# Patient Record
Sex: Female | Born: 1935 | Race: White | Hispanic: No | Marital: Single | Smoking: Never smoker
Health system: Southern US, Community
[De-identification: ages and names within clinical notes are randomized; demographics above are authoritative.]

## PROBLEM LIST (undated history)

## (undated) DIAGNOSIS — R4189 Other symptoms and signs involving cognitive functions and awareness: Secondary | ICD-10-CM

## (undated) DIAGNOSIS — F0390 Unspecified dementia without behavioral disturbance: Secondary | ICD-10-CM

## (undated) DIAGNOSIS — E785 Hyperlipidemia, unspecified: Secondary | ICD-10-CM

## (undated) DIAGNOSIS — K141 Geographic tongue: Secondary | ICD-10-CM

## (undated) DIAGNOSIS — E7800 Pure hypercholesterolemia, unspecified: Secondary | ICD-10-CM

## (undated) DIAGNOSIS — I7300 Raynaud's syndrome without gangrene: Secondary | ICD-10-CM

## (undated) DIAGNOSIS — I341 Nonrheumatic mitral (valve) prolapse: Secondary | ICD-10-CM

---

## 2017-01-03 ENCOUNTER — Other Ambulatory Visit: Payer: Self-pay | Admitting: Nurse Practitioner

## 2017-01-03 DIAGNOSIS — R413 Other amnesia: Secondary | ICD-10-CM

## 2017-01-12 ENCOUNTER — Encounter: Payer: Self-pay | Admitting: Radiology

## 2017-01-12 ENCOUNTER — Ambulatory Visit
Admission: RE | Admit: 2017-01-12 | Discharge: 2017-01-12 | Disposition: A | Discharge status: Home / Self Care | Payer: Medicare Other | Source: Ambulatory Visit | Attending: Nurse Practitioner | Admitting: Nurse Practitioner

## 2017-01-12 DIAGNOSIS — R413 Other amnesia: Secondary | ICD-10-CM

## 2017-01-12 DIAGNOSIS — I6782 Cerebral ischemia: Secondary | ICD-10-CM | POA: Diagnosis not present

## 2017-01-12 DIAGNOSIS — G3184 Mild cognitive impairment, so stated: Secondary | ICD-10-CM | POA: Insufficient documentation

## 2017-01-12 LAB — POCT I-STAT CREATININE: Creatinine, Ser: 0.8 mg/dL (ref 0.44–1.00)

## 2017-01-12 MED ORDER — GADOBENATE DIMEGLUMINE 529 MG/ML IV SOLN
10.0000 mL | Freq: Once | INTRAVENOUS | Status: AC | PRN
  Administered 2017-01-12: 8 mL via INTRAVENOUS

## 2017-05-14 ENCOUNTER — Ambulatory Visit
Admission: EM | Admit: 2017-05-14 | Discharge: 2017-05-14 | Disposition: A | Discharge status: Home / Self Care | Payer: Medicare Other | Attending: Family Medicine | Admitting: Family Medicine

## 2017-05-14 DIAGNOSIS — A084 Viral intestinal infection, unspecified: Secondary | ICD-10-CM | POA: Diagnosis not present

## 2017-05-14 HISTORY — DX: Nonrheumatic mitral (valve) prolapse: I34.1

## 2017-05-14 HISTORY — DX: Raynaud's syndrome without gangrene: I73.00

## 2017-05-14 HISTORY — DX: Geographic tongue: K14.1

## 2017-05-14 HISTORY — DX: Hyperlipidemia, unspecified: E78.5

## 2017-05-14 HISTORY — DX: Pure hypercholesterolemia, unspecified: E78.00

## 2017-05-14 HISTORY — DX: Other symptoms and signs involving cognitive functions and awareness: R41.89

## 2017-05-14 LAB — BASIC METABOLIC PANEL
Anion gap: 11 (ref 5–15)
BUN: 24 mg/dL — AB (ref 6–20)
CHLORIDE: 101 mmol/L (ref 101–111)
CO2: 27 mmol/L (ref 22–32)
CREATININE: 0.84 mg/dL (ref 0.44–1.00)
Calcium: 8.8 mg/dL — ABNORMAL LOW (ref 8.9–10.3)
GFR calc non Af Amer: >60 mL/min (ref >60)
Glucose, Bld: 117 mg/dL — ABNORMAL HIGH (ref 65–99)
Potassium: 3.9 mmol/L (ref 3.5–5.1)
SODIUM: 139 mmol/L (ref 135–145)

## 2017-05-14 MED ORDER — ONDANSETRON 8 MG PO TBDP: 8.0000 mg | ORAL_TABLET | Freq: Three times a day (TID) | ORAL | 0 refills | Status: DC | PRN

## 2017-05-14 NOTE — ED Triage Notes (Signed)
Pt threw at the lunch table today and since she has thrown up 4 times since.

## 2017-05-14 NOTE — ED Provider Notes (Signed)
[facility]    CSN: [id number] Arrival date & time: 05/14/17  1712     History   Chief Complaint Chief Complaint  Patient presents with  . Nausea    HPI Blenda BridegroomLinda Urschel is a 82 y.o. female.   The history is provided by the patient.  Emesis  Severity:  Moderate Duration:  8 hours Timing:  Intermittent Number of daily episodes:  5 Quality:  Stomach contents Able to tolerate:  Liquids Progression:  Unchanged Chronicity:  New Recent urination:  Normal Relieved by:  None tried Ineffective treatments:  None tried Associated symptoms: no abdominal pain, no arthralgias, no chills, no cough, no diarrhea, no fever, no headaches, no myalgias and no URI   Associated symptoms comment:  Mild abdominal cramping and slightly loose stool today Risk factors: sick contacts   Risk factors: no alcohol use, no diabetes, not pregnant, no suspect food intake and no travel to endemic areas     Past Medical History:  Diagnosis Date  . Cognitive impairment   . Geographic tongue   . Hypercholesteremia   . Hyperlipidemia   . Nonrheumatic mitral (valve) prolapse   . Raynaud's syndrome without gangrene     There are no active problems to display for this patient.   History reviewed. No pertinent surgical history.  OB History    No data available       Home Medications    Prior to Admission medications   Medication Sig Start Date End Date Taking? Authorizing Provider  aspirin 81 MG chewable tablet Chew 81 mg by mouth daily.   Yes [provider]  atorvastatin (LIPITOR) 10 MG tablet Take 10 mg by mouth daily.   Yes [provider]  Melatonin 3 MG TABS Take 3 mg by mouth.   Yes [provider]  Multiple Vitamin (MULTIVITAMINS PO) Take by mouth.   Yes [provider]  Polyethylene Glycol 3350 POWD by Does not apply route.   Yes [provider]  ondansetron (ZOFRAN ODT) 8 MG disintegrating tablet Take 1 tablet (8 mg total) by mouth  every 8 (eight) hours as needed. 05/14/17   Payton Mccallumonty, Kristiann Noyce, MD    Family History History reviewed. No pertinent family history.  Social History Social History  Substance Use Topics  . Smoking status: Never Smoker  . Smokeless tobacco: Never Used  . Alcohol use No     Allergies   Patient has no known allergies.   Review of Systems Review of Systems  Constitutional: Negative for chills and fever.  Respiratory: Negative for cough.   Gastrointestinal: Positive for vomiting. Negative for abdominal pain and diarrhea.  Musculoskeletal: Negative for arthralgias and myalgias.  Neurological: Negative for headaches.     Physical Exam Triage Vital Signs ED Triage Vitals  Enc Vitals Group     BP 05/14/17 1734 (!) 164/61     Pulse Rate 05/14/17 1734 97     Resp 05/14/17 1734 18     Temp 05/14/17 1734 98.2 F (36.8 C)     Temp Source 05/14/17 1734 Oral     SpO2 05/14/17 1734 97 %     Weight 05/14/17 1731 108 lb (49 kg)     Height 05/14/17 1731 5\' 3"  (1.6 m)     Head Circumference --      Peak Flow --      Pain Score 05/14/17 1732 5     Pain Loc --      Pain Edu? --  Excl. in GC? --    No data found.   Updated Vital Signs BP (!) 164/61 (BP Location: Right Arm)   Pulse 97   Temp 98.2 F (36.8 C) (Oral)   Resp 18   Ht 5\' 3"  (1.6 m)   Wt 108 lb (49 kg)   SpO2 97%   BMI 19.13 kg/m   Visual Acuity Right Eye Distance:   Left Eye Distance:   Bilateral Distance:    Right Eye Near:   Left Eye Near:    Bilateral Near:     Physical Exam  Constitutional: She appears well-developed and well-nourished. No distress.  Abdominal: Soft. Bowel sounds are normal. She exhibits no distension and no mass. There is no tenderness. There is no rebound and no guarding.  Skin: She is not diaphoretic.  Nursing note and vitals reviewed.    UC Treatments / Results  Labs (all labs ordered are listed, but only abnormal results are displayed) Labs Reviewed  BASIC METABOLIC PANEL  - Abnormal; Notable for the following:       Result Value   Glucose, Bld 117 (*)    BUN 24 (*)    Calcium 8.8 (*)    All other components within normal limits    EKG  EKG Interpretation None       Radiology No results found.  Procedures Procedures (including critical care time)  Medications Ordered in UC Medications - No data to display   Initial Impression / Assessment and Plan / UC Course  I have reviewed the triage vital signs and the nursing notes.  Pertinent labs & imaging results that were available during my care of the patient were reviewed by me and considered in my medical decision making (see chart for details).      Final Clinical Impressions(s) / UC Diagnoses   Final diagnoses:  Viral gastroenteritis    New Prescriptions New Prescriptions   ONDANSETRON (ZOFRAN ODT) 8 MG DISINTEGRATING TABLET    Take 1 tablet (8 mg total) by mouth every 8 (eight) hours as needed.   1. Lab results and diagnosis reviewed with patient 2. rx as per orders above; reviewed possible side effects, interactions, risks and benefits  3. Recommend supportive treatment with clear liquids/increased fluids 4. Follow-up prn if symptoms worsen or don't improve   Payton Mccallum, MD 05/14/17 1857

## 2017-05-19 ENCOUNTER — Encounter: Payer: Self-pay | Admitting: Emergency Medicine

## 2017-05-19 ENCOUNTER — Emergency Department: Payer: Medicare Other

## 2017-05-19 ENCOUNTER — Emergency Department
Admission: EM | Admit: 2017-05-19 | Discharge: 2017-05-19 | Disposition: A | Discharge status: Home / Self Care | Payer: Medicare Other | Attending: Student in an Organized Health Care Education/Training Program | Admitting: Student in an Organized Health Care Education/Training Program

## 2017-05-19 DIAGNOSIS — S0990XA Unspecified injury of head, initial encounter: Secondary | ICD-10-CM | POA: Insufficient documentation

## 2017-05-19 DIAGNOSIS — Y9301 Activity, walking, marching and hiking: Secondary | ICD-10-CM | POA: Diagnosis not present

## 2017-05-19 DIAGNOSIS — S42034A Nondisplaced fracture of lateral end of right clavicle, initial encounter for closed fracture: Secondary | ICD-10-CM | POA: Insufficient documentation

## 2017-05-19 DIAGNOSIS — S32010A Wedge compression fracture of first lumbar vertebra, initial encounter for closed fracture: Secondary | ICD-10-CM | POA: Diagnosis not present

## 2017-05-19 DIAGNOSIS — Y999 Unspecified external cause status: Secondary | ICD-10-CM | POA: Insufficient documentation

## 2017-05-19 DIAGNOSIS — Z7982 Long term (current) use of aspirin: Secondary | ICD-10-CM | POA: Diagnosis not present

## 2017-05-19 DIAGNOSIS — W01198A Fall on same level from slipping, tripping and stumbling with subsequent striking against other object, initial encounter: Secondary | ICD-10-CM | POA: Insufficient documentation

## 2017-05-19 DIAGNOSIS — Y92129 Unspecified place in nursing home as the place of occurrence of the external cause: Secondary | ICD-10-CM | POA: Diagnosis not present

## 2017-05-19 DIAGNOSIS — S4991XA Unspecified injury of right shoulder and upper arm, initial encounter: Secondary | ICD-10-CM | POA: Diagnosis present

## 2017-05-19 DIAGNOSIS — Z79899 Other long term (current) drug therapy: Secondary | ICD-10-CM | POA: Diagnosis not present

## 2017-05-19 MED ORDER — ACETAMINOPHEN 325 MG PO TABS: 650.0000 mg | ORAL_TABLET | ORAL | 2 refills | Status: AC | PRN

## 2017-05-19 MED ORDER — LIDOCAINE 5 % EX PTCH: 1.0000 | MEDICATED_PATCH | Freq: Two times a day (BID) | CUTANEOUS | 0 refills | Status: AC

## 2017-05-19 MED ORDER — ACETAMINOPHEN 325 MG PO TABS
650.0000 mg | ORAL_TABLET | Freq: Once | ORAL | Status: AC
Start: 2017-05-19 — End: 2017-05-19
  Administered 2017-05-19: 650 mg via ORAL
  Filled 2017-05-19: qty 2

## 2017-05-19 NOTE — ED Notes (Signed)
Pt's niece and POA at bedside at this time.

## 2017-05-19 NOTE — ED Notes (Signed)
This RN apologized for delay. Pt and family state understanding at this time. Will continue to monitor for further patient needs at this time. Pt is in NAD.

## 2017-05-19 NOTE — ED Provider Notes (Signed)
[facility] Emergency Department Provider Note    None    (approximate)  I have reviewed the triage vital signs and the nursing notes.   HISTORY  Chief Complaint Fall and Shoulder Pain    HPI Angelica Collins is a 82 y.o. female presents from [facility] after mechanical fall with complaint of right shoulder pain and low back pain. No numbness or tingling. Denies loss of consciousness.  Also has bruising to left forehead but denies any headache or neck pain.No numbness or tingling. No chest pain or shortness of breath. No palpitations. No abdominal pain.   Past Medical History:  Diagnosis Date  . Cognitive impairment   . Geographic tongue   . Hypercholesteremia   . Hyperlipidemia   . Nonrheumatic mitral (valve) prolapse   . Raynaud's syndrome without gangrene    History reviewed. No pertinent family history. History reviewed. No pertinent surgical history. There are no active problems to display for this patient.     Prior to Admission medications   Medication Sig Start Date End Date Taking? Authorizing Provider  aspirin 81 MG chewable tablet Chew 81 mg by mouth daily.    [provider]  atorvastatin (LIPITOR) 10 MG tablet Take 10 mg by mouth daily.    [provider]  Melatonin 3 MG TABS Take 3 mg by mouth.    [provider]  Multiple Vitamin (MULTIVITAMINS PO) Take by mouth.    [provider]  ondansetron (ZOFRAN ODT) 8 MG disintegrating tablet Take 1 tablet (8 mg total) by mouth every 8 (eight) hours as needed. 05/14/17   Payton Mccallumonty, [place], MD  Polyethylene Glycol 3350 POWD by Does not apply route.    [provider]    Allergies Patient has no known allergies.    Social History Social History  Substance Use Topics  . Smoking status: Never Smoker  . Smokeless tobacco: Never Used  . Alcohol use No    Review of Systems Patient denies headaches, rhinorrhea, blurry vision, numbness, shortness  of breath, chest pain, edema, cough, abdominal pain, nausea, vomiting, diarrhea, dysuria, fevers, rashes or hallucinations unless otherwise stated above in HPI. ____________________________________________   PHYSICAL EXAM:  VITAL SIGNS: Vitals:   05/19/17 1308  BP: (!) 161/94  Pulse: (!) 102  Resp: 18  Temp: 98.3 F (36.8 C)    Constitutional: Alert Well appearing and in no acute distress. Eyes: Conjunctivae are normal. EOMI Head: Small contusion and ecchymosis above left eyebrow with dependent ecchymosis medial eye without any proptosis. Nose: No congestion/rhinnorhea. Mouth/Throat: Mucous membranes are moist.   Neck: No stridor. Painless ROM.  Cardiovascular: Normal rate, regular rhythm. Grossly normal heart sounds.  Good peripheral circulation. Respiratory: Normal respiratory effort.  No retractions. Lungs CTAB. Gastrointestinal: Soft and nontender. No distention. No abdominal bruits. No CVA tenderness. Musculoskeletal: No lower extremity tenderness nor edema.  There is tender to palpation of the distal clavicle without effusion. Able to range shoulder but with some discomfort. Also with low back pain around L1-L2 region without overlying ecchymosis, step-offs or deformities. Neurologic:  Normal speech and language. No gross focal neurologic deficits are appreciated. No facial droop.  Able to ambulate without weakness, or significant difficulty. Skin:  Skin is warm, dry and intact. No rash noted. Psychiatric: Mood and affect are normal. Speech and behavior are normal.  ____________________________________________   LABS (all labs ordered are listed, but only abnormal results are displayed)  No results found for this or any previous visit (from the  past 24 hour(s)). ____________________________________________ ____________________________________________  RADIOLOGY  I personally reviewed all radiographic images ordered to evaluate for the above acute complaints and reviewed  radiology reports and findings.  These findings were personally discussed with the patient.  Please see medical record for radiology report.  ____________________________________________   PROCEDURES  Procedure(s) performed:  Procedures    Critical Care performed: no ____________________________________________   INITIAL IMPRESSION / ASSESSMENT AND PLAN / ED COURSE  Pertinent labs & imaging results that were available during my care of the patient were reviewed by me and considered in my medical decision making (see chart for details).  DDX: fracture, contusion, sprain, ich  Angelica Collins is a 82 y.o. who presents to the ED with chief complaint of right shoulder pain and low back pain and head injury as above. CT imaging of the head ordered to evaluate for acute intracranial abnormality shows no injury. No clinical findings suggests a retrobulbar hematoma. Extraocular motions are intact. X-ray ordered to evaluate for shoulder pain and low back pain shows evidence of nondisplaced distal clavicle fracture. Patient placed in sling.  Patient also with evidence of mild compression fracture. She has no evidence of retropulsion and no neuro deficits at this time. She is able to ambulate with no significant discomfort therefore the chronicity of this abnormality is uncertain. We'll give referral for orthopedics. Does have physical therapy available at [facility] which she will continue.  Have discussed with the patient and available family all diagnostics and treatments performed thus far and all questions were answered to the best of my ability. The patient demonstrates understanding and agreement with plan.       ____________________________________________   FINAL CLINICAL IMPRESSION(S) / ED DIAGNOSES  Final diagnoses:  Closed nondisplaced fracture of acromial end of right clavicle, initial encounter  Closed compression fracture of first lumbar vertebra, initial encounter (HCC)       NEW MEDICATIONS STARTED DURING THIS VISIT:  New Prescriptions   No medications on file     Note:  This document was prepared using Dragon voice recognition software and may include unintentional dictation errors.    Willy Eddy, MD 05/19/17 1540

## 2017-05-19 NOTE — ED Notes (Signed)
Pt assisted to the bathroom at this time.

## 2017-05-19 NOTE — ED Triage Notes (Signed)
Pt presents to ED via [facility] s/p mechanical fall. Per EMS pt was walking with her walker when she had an unwitnessed fall. Per EMS no LOC, pt c/o R shoulder pain with a hx of a fracture to that shoulder. Pt also noted to have a hematoma to L side of her forehead, pt denies pain to her head or her neck, states that her back is beginning to hurt at this time. Pt is alert and oriented at this time.

## 2017-05-19 NOTE — ED Notes (Signed)
NAD noted at time of D/C. Pt taken to lobby via wheelchair. Pt D/C into the care of her POA at time of D/C. Denies any comments/concerns at this time.

## 2017-05-23 ENCOUNTER — Other Ambulatory Visit: Payer: Self-pay | Admitting: Orthopedic Surgery

## 2017-05-23 DIAGNOSIS — M545 Low back pain: Secondary | ICD-10-CM

## 2017-05-25 ENCOUNTER — Encounter: Payer: Self-pay | Admitting: Intensive Care

## 2017-05-25 ENCOUNTER — Emergency Department
Admission: EM | Admit: 2017-05-25 | Discharge: 2017-05-25 | Disposition: A | Discharge status: Home / Self Care | Payer: Medicare Other | Attending: Emergency Medicine | Admitting: Emergency Medicine

## 2017-05-25 ENCOUNTER — Emergency Department: Payer: Medicare Other

## 2017-05-25 DIAGNOSIS — Y999 Unspecified external cause status: Secondary | ICD-10-CM | POA: Diagnosis not present

## 2017-05-25 DIAGNOSIS — W19XXXA Unspecified fall, initial encounter: Secondary | ICD-10-CM | POA: Diagnosis not present

## 2017-05-25 DIAGNOSIS — Z79899 Other long term (current) drug therapy: Secondary | ICD-10-CM | POA: Diagnosis not present

## 2017-05-25 DIAGNOSIS — R296 Repeated falls: Secondary | ICD-10-CM

## 2017-05-25 DIAGNOSIS — Z7982 Long term (current) use of aspirin: Secondary | ICD-10-CM | POA: Diagnosis not present

## 2017-05-25 DIAGNOSIS — Y92121 Bathroom in nursing home as the place of occurrence of the external cause: Secondary | ICD-10-CM | POA: Diagnosis not present

## 2017-05-25 DIAGNOSIS — F0390 Unspecified dementia without behavioral disturbance: Secondary | ICD-10-CM | POA: Diagnosis not present

## 2017-05-25 DIAGNOSIS — Y9389 Activity, other specified: Secondary | ICD-10-CM | POA: Insufficient documentation

## 2017-05-25 DIAGNOSIS — Z043 Encounter for examination and observation following other accident: Secondary | ICD-10-CM | POA: Diagnosis present

## 2017-05-25 HISTORY — DX: Unspecified dementia, unspecified severity, without behavioral disturbance, psychotic disturbance, mood disturbance, and anxiety: F03.90

## 2017-05-25 NOTE — ED Provider Notes (Signed)
[facility] Emergency Department Provider Note ____________________________________________   I have reviewed the triage vital signs and the triage nursing note.  HISTORY  Chief Complaint Fall   Historian Patient  HPI Angelica Collins is a 82 y.o. female with a history of dementia who stays at a nursing facility, had reported unwitnessed fall in her bathroom. Patient herself states that she was rushing to get to breakfast early and fell forward. She states she does not really have any additional pains today. She's not sure if she struck her head. Initially states she does not take blood thinners, however aspirin baby is on her medication list, and she is an extremely easy bleeder and she has bruises across her face and her right upper chest wall as a result of a fall a couple days ago.  Denies chest pain or trouble breathing. Denies abdominal pain. Denies extremity pain or injury.    Past Medical History:  Diagnosis Date  . Cognitive impairment   . Dementia   . Geographic tongue   . Hypercholesteremia   . Hyperlipidemia   . Nonrheumatic mitral (valve) prolapse   . Raynaud's syndrome without gangrene     There are no active problems to display for this patient.   History reviewed. No pertinent surgical history.  Prior to Admission medications   Medication Sig Start Date End Date Taking? Authorizing Provider  acetaminophen (TYLENOL) 325 MG tablet Take 2 tablets (650 mg total) by mouth every 4 (four) hours as needed. 05/19/17 05/19/18  Willy Eddyobinson, Patrick, MD  aspirin 81 MG chewable tablet Chew 81 mg by mouth daily.    [provider]  atorvastatin (LIPITOR) 10 MG tablet Take 10 mg by mouth daily.    [provider]  lidocaine (LIDODERM) 5 % Place 1 patch onto the skin every 12 (twelve) hours. Remove & Discard patch within 12 hours or as directed by MD 05/19/17 05/19/18  Willy Eddyobinson, Patrick, MD  Melatonin 3 MG TABS Take 3 mg by mouth.    [provider]  Multiple Vitamin (MULTIVITAMINS PO) Take by mouth.    [provider]  ondansetron (ZOFRAN ODT) 8 MG disintegrating tablet Take 1 tablet (8 mg total) by mouth every 8 (eight) hours as needed. 05/14/17   Payton Mccallumonty, [place], MD  Polyethylene Glycol 3350 POWD by Does not apply route.    [provider]    No Known Allergies  History reviewed. No pertinent family history.  Social History Social History  Substance Use Topics  . Smoking status: Never Smoker  . Smokeless tobacco: Never Used  . Alcohol use No    Review of Systems  Constitutional: recent falls. Eyes: Negative for visual changes. ENT: Negative for sore throat. Cardiovascular: Negative for chest pain. Respiratory: Negative for shortness of breath. Gastrointestinal: Negative for abdominal pain, vomiting and diarrhea. Genitourinary: Negative for dysuria. Musculoskeletal: she has some chronic back pain especially after the last fall, but not anything new from today's fall. Skin: Negative for rash. Neurological: Negative for headache.  ____________________________________________   PHYSICAL EXAM:  VITAL SIGNS: ED Triage Vitals  Enc Vitals Group     BP 05/25/17 0752 (!) 202/95     Pulse Rate 05/25/17 0752 90     Resp 05/25/17 0752 (!) 21     Temp 05/25/17 0752 97.8 F (36.6 C)     Temp Source 05/25/17 0752 Oral     SpO2 05/25/17 0752 98 %     Weight 05/25/17 0749 108 lb (49 kg)  Height 05/25/17 0749 5\' 2"  (1.575 m)     Head Circumference --      Peak Flow --      Pain Score 05/25/17 0748 5     Pain Loc --      Pain Edu? --      Excl. in GC? --      Constitutional: Alert and cooperative. Well appearing and in no distress. HEENT   Head: Normocephalic and atraumatic.      Eyes: Conjunctivae are normal. Pupils equal and round.       Ears:         Nose: No congestion/rhinnorhea.   Mouth/Throat: Mucous membranes are moist.   Neck: No stridor.  No C-spine step-off. No  C-spine tenderness to palpation or range of motion. Cardiovascular/Chest: Normal rate, regular rhythm.  No murmurs, rubs, or gallops. Respiratory: Normal respiratory effort without tachypnea nor retractions. Breath sounds are clear and equal bilaterally. No wheezes/rales/rhonchi. Gastrointestinal: Soft. No distention, no guarding, no rebound. Nontender.    Genitourinary/rectal:Deferred Musculoskeletal: Nontender with normal range of motion in all extremities. No joint effusions.  No lower extremity tenderness.  No edema. Neurologic:  Normal speech and language. No gross or focal neurologic deficits are appreciated. Skin:  Old ecchymosis across the face, right upper chest wall, right arm. Psychiatric: Mood and affect are normal. Speech and behavior are normal. Patient exhibits appropriate insight and judgment.   ____________________________________________  LABS (pertinent positives/negatives)  Labs Reviewed - No data to display  ____________________________________________    EKG I, Governor Rooks, MD, the attending physician have personally viewed and interpreted all ECGs.  89 bpm. There is a normal sinus rhythm. Narrow QRS. Normal axis. Nonspecific ST and T-wave ____________________________________________  RADIOLOGY All Xrays were viewed by me. Imaging interpreted by Radiologist.  CT head without contrast:  IMPRESSION: Atrophy with patchy periventricular small vessel disease, stable. No intracranial mass, hemorrhage, or extra-axial fluid collection. No acute appearing infarct. Areas of arterial vascular calcification evident. Areas of mucosal thickening in several ethmoid air cells noted. There is a small left frontal scalp hematoma. __________________________________________  PROCEDURES  Procedure(s) performed: None  Critical Care performed: None  ____________________________________________   ED COURSE / ASSESSMENT AND PLAN  Pertinent labs & imaging results that  were available during my care of the patient were reviewed by me and considered in my medical decision making (see chart for details).    Angelica Collins had an unwitnessed fall today and is on baby aspirin and is clearly needs Cipro here from prior falls and bruises across her face and arm and chest wall.  On examination she does not have any other significant findings raising suspicion for bony or thoracic or abdominal or traumatic injuries. I discussed with her as well as her son regarding CT head looking for intracranial trauma given the fact that she is on aspirin elderly, and appeared a historian given her dementia and we chose to proceed.  No additional concern based on history or exam for something medical going on and chose to forgo any additional testing or urine testing.    CONSULTATIONS:  none   Patient / Family / Caregiver informed of clinical course, medical decision-making process, and agree with plan.   I discussed return precautions, follow-up instructions, and discharge instructions with patient and/or family.  Discharge Instructions : No serious injuries suspected from the fall today. Return to the emergency medially for any worsening pain, weakness, numbness, confusion altered mental status, or any other symptoms concerning to you.  ___________________________________________   FINAL CLINICAL IMPRESSION(S) / ED DIAGNOSES   Final diagnoses:  Unwitnessed fall              Note: This dictation was prepared with Dragon dictation. Any transcriptional errors that result from this process are unintentional    Governor Rooks, MD 05/25/17 [phone]

## 2017-05-25 NOTE — ED Triage Notes (Signed)
Patient arrived from [facility] by EMS for unwitnessed fall. Patient states "I was running late for breakfast being served this AM and fell. I was in to much of a hurry. I don't remember falling" HX of beginning stages of dementia. A&O x4 during triage

## 2017-05-25 NOTE — Discharge Instructions (Signed)
No serious injuries suspected from the fall today. Return to the emergency medially for any worsening pain, weakness, numbness, confusion altered mental status, or any other symptoms concerning to you.

## 2017-05-25 NOTE — ED Notes (Signed)
Patient ambulated with this RN to bathroom with assistance with no problems. Patient shuffles and tries to move to fast.

## 2017-05-25 NOTE — ED Notes (Signed)
ED Provider at bedside. 

## 2017-05-30 ENCOUNTER — Ambulatory Visit
Admission: RE | Admit: 2017-05-30 | Discharge: 2017-05-30 | Disposition: A | Discharge status: Home / Self Care | Payer: Medicare Other | Source: Ambulatory Visit | Attending: Orthopedic Surgery | Admitting: Orthopedic Surgery

## 2017-05-30 DIAGNOSIS — M545 Low back pain: Secondary | ICD-10-CM | POA: Diagnosis present

## 2017-05-30 DIAGNOSIS — S22080A Wedge compression fracture of T11-T12 vertebra, initial encounter for closed fracture: Secondary | ICD-10-CM | POA: Diagnosis not present

## 2017-05-30 DIAGNOSIS — S32010A Wedge compression fracture of first lumbar vertebra, initial encounter for closed fracture: Secondary | ICD-10-CM | POA: Insufficient documentation

## 2017-05-30 DIAGNOSIS — M5136 Other intervertebral disc degeneration, lumbar region: Secondary | ICD-10-CM | POA: Diagnosis not present

## 2017-05-30 DIAGNOSIS — X58XXXA Exposure to other specified factors, initial encounter: Secondary | ICD-10-CM | POA: Diagnosis not present

## 2018-09-25 ENCOUNTER — Emergency Department
Admission: EM | Admit: 2018-09-25 | Discharge: 2018-09-26 | Disposition: A | Discharge status: Home / Self Care | Payer: Medicare Other | Attending: Emergency Medicine | Admitting: Emergency Medicine

## 2018-09-25 ENCOUNTER — Other Ambulatory Visit: Payer: Self-pay

## 2018-09-25 ENCOUNTER — Emergency Department: Payer: Medicare Other

## 2018-09-25 DIAGNOSIS — S59911A Unspecified injury of right forearm, initial encounter: Secondary | ICD-10-CM | POA: Diagnosis present

## 2018-09-25 DIAGNOSIS — Y9389 Activity, other specified: Secondary | ICD-10-CM | POA: Diagnosis not present

## 2018-09-25 DIAGNOSIS — F0390 Unspecified dementia without behavioral disturbance: Secondary | ICD-10-CM | POA: Insufficient documentation

## 2018-09-25 DIAGNOSIS — Y92129 Unspecified place in nursing home as the place of occurrence of the external cause: Secondary | ICD-10-CM | POA: Diagnosis not present

## 2018-09-25 DIAGNOSIS — Y998 Other external cause status: Secondary | ICD-10-CM | POA: Insufficient documentation

## 2018-09-25 DIAGNOSIS — S5011XA Contusion of right forearm, initial encounter: Secondary | ICD-10-CM | POA: Insufficient documentation

## 2018-09-25 DIAGNOSIS — W01198A Fall on same level from slipping, tripping and stumbling with subsequent striking against other object, initial encounter: Secondary | ICD-10-CM | POA: Diagnosis not present

## 2018-09-25 DIAGNOSIS — T148XXA Other injury of unspecified body region, initial encounter: Secondary | ICD-10-CM

## 2018-09-25 DIAGNOSIS — Z7982 Long term (current) use of aspirin: Secondary | ICD-10-CM | POA: Insufficient documentation

## 2018-09-25 DIAGNOSIS — Z79899 Other long term (current) drug therapy: Secondary | ICD-10-CM | POA: Diagnosis not present

## 2018-09-25 DIAGNOSIS — S40021A Contusion of right upper arm, initial encounter: Secondary | ICD-10-CM

## 2018-09-25 NOTE — ED Provider Notes (Signed)
[facility] Emergency Department Provider Note  ____________________________________________   First MD Initiated Contact with Patient 09/25/18 2303     (approximate)  I have reviewed the triage vital signs and the nursing notes.   HISTORY  Chief Complaint Fall    HPI Angelica Collins is a 83 y.o. female with some dementia who presents for evaluation after a witnessed fall earlier today.  She reports that she just got tripped up while she was going to dinner and fell onto her right arm.  She sustained no other injuries including no strike to her head and she reports no neck pain or headache.  This injury occurred at least 6 hours prior to her arrival to the emergency department.  She was in the process of getting ready for bed when she noticed that her arm was significantly swollen even though she has only mild pain.  She has someone at the facility look at it and they told her the arm was probably broken and she should come to the emergency department.  She reports some mild aching in the proximal right forearm and has a very small skin tear but otherwise she has normal range of motion of the arm without any reproducible pain or tenderness.  She has no numbness or tingling down into her hand.  The swelling is significant and there is a lot of bruising.  She has no other complaints or concerns including chest pain, shortness of breath, nausea, vomiting, and abdominal pain.  She did not lose consciousness.  She is not on any blood thinners.  Past Medical History:  Diagnosis Date  . Cognitive impairment   . Dementia (HCC)   . Geographic tongue   . Hypercholesteremia   . Hyperlipidemia   . Nonrheumatic mitral (valve) prolapse   . Raynaud's syndrome without gangrene     There are no active problems to display for this patient.   History reviewed. No pertinent surgical history.  Prior to Admission medications   Medication Sig Start Date End Date Taking?  Authorizing Provider  aspirin 81 MG chewable tablet Chew 81 mg by mouth daily.    [provider]  atorvastatin (LIPITOR) 10 MG tablet Take 10 mg by mouth daily.    [provider]  Melatonin 3 MG TABS Take 3 mg by mouth.    [provider]  Multiple Vitamin (MULTIVITAMINS PO) Take by mouth.    [provider]  ondansetron (ZOFRAN ODT) 8 MG disintegrating tablet Take 1 tablet (8 mg total) by mouth every 8 (eight) hours as needed. 05/14/17   Payton Mccallum, MD  Polyethylene Glycol 3350 POWD by Does not apply route.    [provider]    Allergies Patient has no known allergies.  History reviewed. No pertinent family history.  Social History Social History   Tobacco Use  . Smoking status: Never Smoker  . Smokeless tobacco: Never Used  Substance Use Topics  . Alcohol use: No  . Drug use: No    Review of Systems Constitutional: No fever/chills Eyes: No visual changes. ENT: No sore throat. Cardiovascular: Denies chest pain. Respiratory: Denies shortness of breath. Gastrointestinal: No abdominal pain.  No nausea, no vomiting.  No diarrhea.  No constipation. Genitourinary: Negative for dysuria. Musculoskeletal: Mild pain but with significant swelling to the proximal right forearm after a fall as described above Integumentary: Negative for rash. Neurological: Negative for headaches, focal weakness or numbness.   ____________________________________________   PHYSICAL EXAM:  VITAL SIGNS: ED  Triage Vitals  Enc Vitals Group     BP 09/25/18 2250 (!) 196/91     Pulse Rate 09/25/18 2250 (!) 101     Resp 09/25/18 2250 16     Temp 09/25/18 2250 98.7 F (37.1 C)     Temp Source 09/25/18 2250 Oral     SpO2 09/25/18 2247 97 %     Weight 09/25/18 2251 57.8 kg (127 lb 6.4 oz)     Height 09/25/18 2251 1.626 m (5\' 4" )     Head Circumference --      Peak Flow --      Pain Score 09/25/18 2250 7     Pain Loc --      Pain Edu? --      Excl.  in GC? --     Constitutional: Alert and oriented.  Elderly but generally well appearing and in no acute distress. Eyes: Conjunctivae are normal.  Head: Atraumatic. Nose: No congestion/rhinnorhea. Mouth/Throat: Mucous membranes are moist. Neck: No stridor.  No meningeal signs.   Cardiovascular: Normal rate, regular rhythm. Good peripheral circulation. Grossly normal heart sounds. Respiratory: Normal respiratory effort.  No retractions. Lungs CTAB. Gastrointestinal: Soft and nontender. No distention.  Musculoskeletal: The patient has significant swelling of the proximal right forearm with ecchymosis that extends over the elbow.  The skin is taut but the compartments are still compressible even though the arm is definitely tense.  She still has easily palpable radial pulse and normal perfusion down to the fingers, normal grip strength, normal range of motion of the fingers, wrist, and affected elbow with no reproducible pain, just some mild tenderness upon palpation of the edematous region.  There is a very small skin tear but not requiring any repair. Neurologic:  Normal speech and language. No gross focal neurologic deficits are appreciated.  Skin:  Skin is warm, dry and intact. No rash noted. Psychiatric: Mood and affect are normal. Speech and behavior are normal.  ____________________________________________   LABS (all labs ordered are listed, but only abnormal results are displayed)  Labs Reviewed - No data to display ____________________________________________  EKG  None - EKG not ordered by ED physician ____________________________________________  RADIOLOGY Angelica Collins, personally viewed and evaluated these images (plain radiographs) as part of my medical decision making, as well as reviewing the written report by the radiologist.  ED MD interpretation: No evidence of fracture or dislocation but with significant swelling of the proximal and mid forearm  Official  radiology report(s): Dg Forearm Right  Result Date: 09/25/2018 CLINICAL DATA:  Pain, deformity after fall EXAM: RIGHT FOREARM - 2 VIEW COMPARISON:  None. FINDINGS: No acute displaced fracture or malalignment. No significant elbow effusion. Prominent soft tissue swelling over the elbow and proximal to mid forearm. IMPRESSION: No acute osseous abnormality. Marked soft tissue swelling along the dorsal aspect of the proximal and mid forearm and at the elbow Electronically Signed   By: Jasmine Pang M.D.   On: 09/25/2018 23:21    ____________________________________________   PROCEDURES  Critical Care performed: No   Procedure(s) performed:   Procedures   ____________________________________________   INITIAL IMPRESSION / ASSESSMENT AND PLAN / ED COURSE  As part of my medical decision making, I reviewed the following data within the electronic MEDICAL RECORD NUMBER Nursing notes reviewed and incorporated, Old chart reviewed, Radiograph reviewed  and Notes from prior ED visits    Differential diagnosis includes, but is not limited to, fracture or dislocation of the patient's elbow or radius/ulna, hematoma,  compartment syndrome.  Causes of the fall can include mechanical fall, syncope, acute infection, etc.  The patient was asymptomatic prior to the fall, and in spite of her mild dementia she gives me a clear history of tripping, no loss of consciousness, no other injury.  There is no indication for further medical work-up at this time; even checking urinalysis could lead to unnecessary treatment of asymptomatic bacteriuria in an elderly woman with no symptoms and complications associated with unnecessary antibiotics.  Surprisingly based on her physical exam, she has no evidence of fracture or dislocation, verified both by radiology and by my own review of the images.  She is not on blood thinner but she has a substantial hematoma.   Her compartments are taut but still compressible and she has  almost no pain, she was flexing and extending her elbow with absolutely no difficulty and no pain or tenderness, she has normal range of motion of her wrist, normal perfusion of her fingers, and no subjective sensory deficit.  She has no signs of compartment syndrome at this time.  I feel that placing her in a splint could be detrimental because of the excessive and unnecessary pressure we will be putting on and already swollen and tight arm.  I am providing a very gentle Ace wrap for comfort, but put strict precautions and the patient's discharge instructions that tight wraps should be avoided.  I encouraged elevation as much as possible, ice packs several times a day for no more than 15 minutes at a time, and close follow-up with Dr. Rosita Kea, whom the patient has seen previously.  I am providing a sling for comfort but also indicated in the discharge instructions that the sling is not necessary, only if the patient wants to use it.    ____________________________________________  FINAL CLINICAL IMPRESSION(S) / ED DIAGNOSES  Final diagnoses:  Arm contusion, right, initial encounter  Traumatic hematoma     MEDICATIONS GIVEN DURING THIS VISIT:  Medications - No data to display   ED Discharge Orders    None       Note:  This document was prepared using Dragon voice recognition software and may include unintentional dictation errors.    Loleta Rose, MD 09/26/18 [phone]

## 2018-09-25 NOTE — ED Triage Notes (Signed)
Pt arrived via EMS from [facility] after a witnessed fall x 2 today. Rt arm pain, swelling, and discoloration. Denies LOC.

## 2018-09-26 NOTE — ED Notes (Signed)
Pt able to stand and pivot off bed onto bedside commode.

## 2018-09-26 NOTE — ED Notes (Signed)
EMS called and spoke with Victorino Dike.

## 2018-09-26 NOTE — ED Notes (Signed)
RN called Pts niece who is listed as emergency contact who verbalized pt needed to be transported back to [facility] via EMS.

## 2018-09-26 NOTE — ED Notes (Signed)
RN called Angelica Collins to give report and ask about transportation. Report given to [place] who advised RN to call pts family for transportation and otherwise pt would need EMS transfer.

## 2018-09-26 NOTE — Discharge Instructions (Addendum)
Surprisingly, you did NOT break any bones.  You have a large amount of swelling due to bleeding under the skin, called a hematoma, but your body will reabsorb this over time.  We provided a very loose Ace wrap bandage to provide gentle pressure, but it is extremely important that you not have a tight wrap around your arm or even a splint because too much pressure may cause you to lose circulation and even cause nerve damage in your arm, called compartment syndrome.  Please try to keep your arm elevated on pillows as much as possible and try using cold packs several times a day for no more than 15 minutes at a time to try to keep the swelling down.  We provided a sling to use for comfort, but you do not need to use it if you do not want it.   You have seen Dr. Rosita Kea with orthopedic surgery in the past and we encourage you to call tomorrow to schedule a follow-up appointment either at the end of this week or early next week for reassessment and evaluation.  If you develop new or worsening symptoms, such as numbness or tingling in your hand or arm, severe pain, or your hand or arm becomes cold below the area of the injury, please return immediately to the emergency department after removing even a gently wrapped Ace bandage.

## 2019-02-09 ENCOUNTER — Other Ambulatory Visit: Payer: Self-pay

## 2019-02-09 ENCOUNTER — Emergency Department
Admission: EM | Admit: 2019-02-09 | Discharge: 2019-02-09 | Disposition: A | Discharge status: Home / Self Care | Payer: Medicare Other | Attending: Emergency Medicine | Admitting: Emergency Medicine

## 2019-02-09 DIAGNOSIS — Z79899 Other long term (current) drug therapy: Secondary | ICD-10-CM | POA: Diagnosis not present

## 2019-02-09 DIAGNOSIS — Z7982 Long term (current) use of aspirin: Secondary | ICD-10-CM | POA: Diagnosis not present

## 2019-02-09 DIAGNOSIS — F0390 Unspecified dementia without behavioral disturbance: Secondary | ICD-10-CM | POA: Insufficient documentation

## 2019-02-09 DIAGNOSIS — Z043 Encounter for examination and observation following other accident: Secondary | ICD-10-CM | POA: Insufficient documentation

## 2019-02-09 DIAGNOSIS — W19XXXA Unspecified fall, initial encounter: Secondary | ICD-10-CM

## 2019-02-09 LAB — CBC WITH DIFFERENTIAL/PLATELET
ABS IMMATURE GRANULOCYTES: 0.07 10*3/uL (ref 0.00–0.07)
BASOS ABS: 0 10*3/uL (ref 0.0–0.1)
BASOS PCT: 0 %
EOS ABS: 0 10*3/uL (ref 0.0–0.5)
Eosinophils Relative: 0 %
HCT: 44.5 % (ref 36.0–46.0)
Hemoglobin: 14.1 g/dL (ref 12.0–15.0)
IMMATURE GRANULOCYTES: 1 %
Lymphocytes Relative: 3 %
Lymphs Abs: 0.4 10*3/uL — ABNORMAL LOW (ref 0.7–4.0)
MCH: 28 pg (ref 26.0–34.0)
MCHC: 31.7 g/dL (ref 30.0–36.0)
MCV: 88.5 fL (ref 80.0–100.0)
MONOS PCT: 5 %
Monocytes Absolute: 0.7 10*3/uL (ref 0.1–1.0)
NEUTROS ABS: 12 10*3/uL — AB (ref 1.7–7.7)
NEUTROS PCT: 91 %
NRBC: 0 % (ref 0.0–0.2)
PLATELETS: 177 10*3/uL (ref 150–400)
RBC: 5.03 MIL/uL (ref 3.87–5.11)
RDW: 15.6 % — AB (ref 11.5–15.5)
WBC: 13.3 10*3/uL — ABNORMAL HIGH (ref 4.0–10.5)

## 2019-02-09 LAB — URINALYSIS, COMPLETE (UACMP) WITH MICROSCOPIC
BACTERIA UA: NONE SEEN
BILIRUBIN URINE: NEGATIVE
GLUCOSE, UA: NEGATIVE mg/dL
HGB URINE DIPSTICK: NEGATIVE
Ketones, ur: 5 mg/dL — AB
LEUKOCYTE UA: NEGATIVE
NITRITE: NEGATIVE
PH: 7 (ref 5.0–8.0)
Protein, ur: NEGATIVE mg/dL
SPECIFIC GRAVITY, URINE: 1.017 (ref 1.005–1.030)

## 2019-02-09 LAB — BASIC METABOLIC PANEL
Anion gap: 12 (ref 5–15)
BUN: 20 mg/dL (ref 8–23)
CO2: 22 mmol/L (ref 22–32)
Calcium: 9.1 mg/dL (ref 8.9–10.3)
Chloride: 104 mmol/L (ref 98–111)
Creatinine, Ser: 0.74 mg/dL (ref 0.44–1.00)
GFR calc Af Amer: >60 mL/min (ref >60)
GLUCOSE: 117 mg/dL — AB (ref 70–99)
POTASSIUM: 4.1 mmol/L (ref 3.5–5.1)
Sodium: 138 mmol/L (ref 135–145)

## 2019-02-09 LAB — TROPONIN I
TROPONIN I: 0.03 ng/mL — AB (ref <0.03)
TROPONIN I: 0.03 ng/mL — AB (ref <0.03)

## 2019-02-09 MED ORDER — SODIUM CHLORIDE 0.9 % IV BOLUS
500.0000 mL | Freq: Once | INTRAVENOUS | Status: AC
  Administered 2019-02-09: 500 mL via INTRAVENOUS

## 2019-02-09 NOTE — ED Notes (Addendum)
Called [facility] to request transportation back to facility.  Facility informed Clinical research associate that they did not have any transportation and that it would need to be family responsibility to transport patient back to facility.  Patient's Niece, Erskine Squibb, called to request transportation back to facility.  Stated that she did not have a vehicle available to transport patient and requested Ambulance transport.   Informed Niece that the cost of transport would fall to the patient, and Niece responded that she understood that and to go ahead and arrange ambulance transport.  Explained situation to patient and asked patient if she would be able to pay for a taxi cab, patient stated that niece had her money and controlled her money.  Called Niece back to ask her if she would be able to arrange a taxi cab transport.  Niece stated that she would be up to pick Aunt up "in a few minutes".

## 2019-02-09 NOTE — ED Notes (Signed)
Date and time results received: 02/09/19 9:30 am  (use smartphrase ".now" to insert current time)  Test: Troponin Critical Value: 0.03  Name of Provider Notified: [facility]

## 2019-02-09 NOTE — ED Provider Notes (Signed)
[facility] Emergency Department Provider Note ____________________________________________   None    (approximate)  I have reviewed the triage vital signs and the nursing notes.   HISTORY  Chief Complaint Fall  Level 5 caveat: History of present illness limited due to dementia  HPI Angelica Collins is a 84 y.o. female with PMH as noted below who presents after a possible unwitnessed fall.  The patient was found by staff at [facility] sitting on the floor.  The patient states that she did not fall but was reaching for her light and sat down on the floor.  She denies any pain anywhere or any acute symptoms.  She states she has not been feeling ill.  Past Medical History:  Diagnosis Date  . Cognitive impairment   . Dementia (HCC)   . Geographic tongue   . Hypercholesteremia   . Hyperlipidemia   . Nonrheumatic mitral (valve) prolapse   . Raynaud's syndrome without gangrene     There are no active problems to display for this patient.   History reviewed. No pertinent surgical history.  Prior to Admission medications   Medication Sig Start Date End Date Taking? Authorizing Provider  acetaminophen (TYLENOL) 325 MG tablet Take 650 mg by mouth every 4 (four) hours. 05/31/18  Yes [provider]  aspirin 81 MG chewable tablet Chew 81 mg by mouth daily.   Yes [provider]  atorvastatin (LIPITOR) 10 MG tablet Take 10 mg by mouth daily.   Yes [provider]  calcium carbonate (OS-CAL) 600 MG tablet Take 1 tablet by mouth daily.   Yes [provider]  sertraline (ZOLOFT) 100 MG tablet Take 100 mg by mouth daily. 01/18/19  Yes [provider]  ondansetron (ZOFRAN ODT) 8 MG disintegrating tablet Take 1 tablet (8 mg total) by mouth every 8 (eight) hours as needed. 05/14/17   Payton Mccallum, MD  Polyethylene Glycol 3350 POWD by Does not apply route.    [provider]    Allergies Patient has no known  allergies.  No family history on file.  Social History Social History   Tobacco Use  . Smoking status: Never Smoker  . Smokeless tobacco: Never Used  Substance Use Topics  . Alcohol use: No  . Drug use: No    Review of Systems Level 5 caveat: Review of systems limited due to dementia Constitutional: No fever. Eyes: No redness. Cardiovascular: Denies chest pain. Respiratory: Denies shortness of breath. Gastrointestinal: No vomiting or diarrhea.  Genitourinary: Negative for dysuria.  Musculoskeletal: Negative for back pain. Skin: Negative for rash. Neurological: Negative for headache.   ____________________________________________   PHYSICAL EXAM:  VITAL SIGNS: ED Triage Vitals  Enc Vitals Group     BP      Pulse      Resp      Temp      Temp src      SpO2      Weight      Height      Head Circumference      Peak Flow      Pain Score      Pain Loc      Pain Edu?      Excl. in GC?     Constitutional: Alert and oriented.  Relatively well appearing and in no acute distress. Eyes: Conjunctivae are normal.  EOMI.  PERRLA. Head: Atraumatic. Nose: No congestion/rhinnorhea. Mouth/Throat: Mucous membranes are slightly dry.   Neck: Normal range of motion.  No midline cervical spinal tenderness. Cardiovascular: Normal rate, regular rhythm. Grossly normal heart sounds.  Good peripheral circulation. Respiratory: Normal respiratory effort.  No retractions. Lungs CTAB. Gastrointestinal: Soft and nontender. No distention.  Genitourinary: No flank tenderness. Musculoskeletal: No lower extremity edema.  Extremities warm and well perfused.  No midline spinal tenderness.   Neurologic:  Normal speech and language.  Motor and sensory intact in all extremities.  No facial droop.  Normal coordination. Skin:  Skin is warm and dry. No rash noted. Psychiatric: Mood and affect are normal. Speech and behavior are normal.  ____________________________________________   LABS (all  labs ordered are listed, but only abnormal results are displayed)  Labs Reviewed  BASIC METABOLIC PANEL - Abnormal; Notable for the following components:      Result Value   Glucose, Bld 117 (*)    All other components within normal limits  CBC WITH DIFFERENTIAL/PLATELET - Abnormal; Notable for the following components:   WBC 13.3 (*)    RDW 15.6 (*)    Neutro Abs 12.0 (*)    Lymphs Abs 0.4 (*)    All other components within normal limits  TROPONIN I - Abnormal; Notable for the following components:   Troponin I 0.03 (*)    All other components within normal limits  URINALYSIS, COMPLETE (UACMP) WITH MICROSCOPIC - Abnormal; Notable for the following components:   Color, Urine YELLOW (*)    APPearance CLEAR (*)    Ketones, ur 5 (*)    All other components within normal limits  TROPONIN I - Abnormal; Notable for the following components:   Troponin I 0.03 (*)    All other components within normal limits   ____________________________________________  EKG  ED ECG REPORT I, Dionne Bucy, the attending physician, personally viewed and interpreted this ECG.  Date: 02/09/2019 EKG Time: 0835 Rate: 100 Rhythm: normal sinus rhythm QRS Axis: normal Intervals: normal ST/T Wave abnormalities: normal Narrative Interpretation: no evidence of acute ischemia  ____________________________________________  RADIOLOGY    ____________________________________________   PROCEDURES  Procedure(s) performed: No  Procedures  Critical Care performed: No ____________________________________________   INITIAL IMPRESSION / ASSESSMENT AND PLAN / ED COURSE  Pertinent labs & imaging results that were available during my care of the patient were reviewed by me and considered in my medical decision making (see chart for details).  84 year old female with PMH as noted above including mild dementia presents after an apparent fall when she was found sitting on the floor at her facility.   The patient denies falling, and states that she feels fine.  She denies any recent illness and has no pain currently.  EMS reports the patient has a facial droop which is chronic and has gait abnormality at baseline.  She was apparently incontinent of feces this morning.  I reviewed the past medical records in Epic; the patient has had somewhat similar presentations for falls in 2018 and 2019.  On exam currently, the patient is well-appearing for her age.  Her vital signs are normal except for hypertension.  Her neurologic exam is nonfocal and I cannot appreciate any significant facial droop or other abnormalities.  She has slightly dry mucous membranes.  At this time, it is unclear whether the patient actually fell or merely sat down on the floor and could not get up.  If she did fall, there is no evidence of injury.  It is possible that she is mildly dehydrated.  Differential also includes electrolyte abnormality or other metabolic etiology, infection, or less  likely cardiac cause.  We will obtain basic labs, troponin, and UA to rule out a precipitating cause of a fall.  There is no indication for any imaging based on the patient's exam.  I anticipate discharge back to her facility if the work-up is negative.  ----------------------------------------- 11:55 AM on 02/09/2019 -----------------------------------------  The patient's lab work-up is unremarkable except her initial troponin was minimally elevated.  I do not have an old value to compare to although I suspect that this may be chronic as the patient has no signs or symptoms of ACS.  Repeat troponin after several hours was unchanged and the patient continues to have no active symptoms.  The patient does have a slightly elevated WBC count although this is very nonspecific and the patient has no other findings to suggest infection.    The patient's niece and power of attorney came to the hospital and I was able to discuss the patient's care  with her.  She confirmed that the patient is at her baseline mental status and feels comfortable with her going back to her facility if the work-up is negative.  At this time the patient is stable for discharge home.  Return precautions were given and both the patient and the niece expressed understanding. ____________________________________________   FINAL CLINICAL IMPRESSION(S) / ED DIAGNOSES  Final diagnoses:  Fall, initial encounter      NEW MEDICATIONS STARTED DURING THIS VISIT:  New Prescriptions   No medications on file     Note:  This document was prepared using Dragon voice recognition software and may include unintentional dictation errors.    Dionne Bucy, MD 02/09/19 1157

## 2019-02-09 NOTE — Discharge Instructions (Addendum)
Return to the ER for new, worsening, or recurrent weakness, recurrent falls, or any other acute concerns.

## 2019-02-09 NOTE — ED Triage Notes (Addendum)
Report :[facility] in [place] unwitnessed fall. Incontinent of feces AAO4 170/70  125 CBG  98.9temp 100HR  EMS reports slight droop in her face, mild cognitive impairment, and gait abnormalities that is at patients baseline. Negative stroke screen, DNR,  PT History :Hyperlipidemia mital valve prolapse. No breakfast    Patient AAOx3 denies fall states she was just in a rush and sat down on the floor.

## 2019-03-26 ENCOUNTER — Emergency Department: Payer: Medicare Other

## 2019-03-26 ENCOUNTER — Other Ambulatory Visit: Payer: Self-pay

## 2019-03-26 ENCOUNTER — Inpatient Hospital Stay
Admission: EM | Admit: 2019-03-26 | Discharge: 2019-03-28 | DRG: 871 | Disposition: A | Discharge status: Home / Self Care | Payer: Medicare Other | Attending: Internal Medicine | Admitting: Internal Medicine

## 2019-03-26 DIAGNOSIS — J189 Pneumonia, unspecified organism: Secondary | ICD-10-CM | POA: Diagnosis present

## 2019-03-26 DIAGNOSIS — F0390 Unspecified dementia without behavioral disturbance: Secondary | ICD-10-CM | POA: Diagnosis present

## 2019-03-26 DIAGNOSIS — B370 Candidal stomatitis: Secondary | ICD-10-CM | POA: Diagnosis present

## 2019-03-26 DIAGNOSIS — Z66 Do not resuscitate: Secondary | ICD-10-CM | POA: Diagnosis present

## 2019-03-26 DIAGNOSIS — A419 Sepsis, unspecified organism: Secondary | ICD-10-CM | POA: Diagnosis not present

## 2019-03-26 DIAGNOSIS — Z79899 Other long term (current) drug therapy: Secondary | ICD-10-CM

## 2019-03-26 DIAGNOSIS — Z20828 Contact with and (suspected) exposure to other viral communicable diseases: Secondary | ICD-10-CM | POA: Diagnosis present

## 2019-03-26 DIAGNOSIS — F329 Major depressive disorder, single episode, unspecified: Secondary | ICD-10-CM | POA: Diagnosis present

## 2019-03-26 DIAGNOSIS — E7800 Pure hypercholesterolemia, unspecified: Secondary | ICD-10-CM | POA: Diagnosis present

## 2019-03-26 DIAGNOSIS — E785 Hyperlipidemia, unspecified: Secondary | ICD-10-CM | POA: Diagnosis present

## 2019-03-26 DIAGNOSIS — Z7982 Long term (current) use of aspirin: Secondary | ICD-10-CM

## 2019-03-26 DIAGNOSIS — R4182 Altered mental status, unspecified: Secondary | ICD-10-CM | POA: Diagnosis not present

## 2019-03-26 DIAGNOSIS — R739 Hyperglycemia, unspecified: Secondary | ICD-10-CM | POA: Diagnosis present

## 2019-03-26 DIAGNOSIS — J181 Lobar pneumonia, unspecified organism: Secondary | ICD-10-CM

## 2019-03-26 LAB — URINALYSIS, COMPLETE (UACMP) WITH MICROSCOPIC
Bacteria, UA: NONE SEEN
Bilirubin Urine: NEGATIVE
Glucose, UA: NEGATIVE mg/dL
Hgb urine dipstick: NEGATIVE
Ketones, ur: 5 mg/dL — AB
Leukocytes,Ua: NEGATIVE
Nitrite: NEGATIVE
Protein, ur: 100 mg/dL — AB
Specific Gravity, Urine: 1.028 (ref 1.005–1.030)
WBC, UA: NONE SEEN WBC/hpf (ref 0–5)
pH: 5 (ref 5.0–8.0)

## 2019-03-26 LAB — CBC WITH DIFFERENTIAL/PLATELET
Abs Immature Granulocytes: 0.16 10*3/uL — ABNORMAL HIGH (ref 0.00–0.07)
Basophils Absolute: 0 10*3/uL (ref 0.0–0.1)
Basophils Relative: 0 %
Eosinophils Absolute: 0 10*3/uL (ref 0.0–0.5)
Eosinophils Relative: 0 %
HCT: 40.2 % (ref 36.0–46.0)
Hemoglobin: 13 g/dL (ref 12.0–15.0)
Immature Granulocytes: 1 %
Lymphocytes Relative: 5 %
Lymphs Abs: 0.8 10*3/uL (ref 0.7–4.0)
MCH: 28.1 pg (ref 26.0–34.0)
MCHC: 32.3 g/dL (ref 30.0–36.0)
MCV: 87 fL (ref 80.0–100.0)
Monocytes Absolute: 0.9 10*3/uL (ref 0.1–1.0)
Monocytes Relative: 5 %
Neutro Abs: 16.2 10*3/uL — ABNORMAL HIGH (ref 1.7–7.7)
Neutrophils Relative %: 89 %
Platelets: 323 10*3/uL (ref 150–400)
RBC: 4.62 MIL/uL (ref 3.87–5.11)
RDW: 15 % (ref 11.5–15.5)
WBC: 18.1 10*3/uL — ABNORMAL HIGH (ref 4.0–10.5)
nRBC: 0 % (ref 0.0–0.2)

## 2019-03-26 LAB — COMPREHENSIVE METABOLIC PANEL
ALT: 20 U/L (ref 0–44)
AST: 33 U/L (ref 15–41)
Albumin: 3.1 g/dL — ABNORMAL LOW (ref 3.5–5.0)
Alkaline Phosphatase: 115 U/L (ref 38–126)
Anion gap: 14 (ref 5–15)
BUN: 19 mg/dL (ref 8–23)
CO2: 26 mmol/L (ref 22–32)
Calcium: 8.9 mg/dL (ref 8.9–10.3)
Chloride: 98 mmol/L (ref 98–111)
Creatinine, Ser: 0.64 mg/dL (ref 0.44–1.00)
GFR calc Af Amer: >60 mL/min (ref >60)
GFR calc non Af Amer: >60 mL/min (ref >60)
Glucose, Bld: 135 mg/dL — ABNORMAL HIGH (ref 70–99)
Potassium: 4.1 mmol/L (ref 3.5–5.1)
Sodium: 138 mmol/L (ref 135–145)
Total Bilirubin: 0.6 mg/dL (ref 0.3–1.2)
Total Protein: 7.8 g/dL (ref 6.5–8.1)

## 2019-03-26 LAB — SARS CORONAVIRUS 2 BY RT PCR (HOSPITAL ORDER, PERFORMED IN ~~LOC~~ HOSPITAL LAB): SARS Coronavirus 2: NEGATIVE

## 2019-03-26 LAB — GLUCOSE, CAPILLARY: Glucose-Capillary: 100 mg/dL — ABNORMAL HIGH (ref 70–99)

## 2019-03-26 LAB — TROPONIN I: Troponin I: <0.03 ng/mL (ref <0.03)

## 2019-03-26 LAB — PROCALCITONIN: Procalcitonin: 0.43 ng/mL

## 2019-03-26 LAB — LACTIC ACID, PLASMA: Lactic Acid, Venous: 0.9 mmol/L (ref 0.5–1.9)

## 2019-03-26 MED ORDER — ATORVASTATIN CALCIUM 20 MG PO TABS
10.0000 mg | ORAL_TABLET | Freq: Every day | ORAL | Status: DC
  Administered 2019-03-27: 10 mg via ORAL
  Filled 2019-03-26: qty 1

## 2019-03-26 MED ORDER — SODIUM CHLORIDE 0.9 % IV SOLN
500.0000 mg | INTRAVENOUS | Status: DC
  Administered 2019-03-27: 18:00:00 500 mg via INTRAVENOUS
  Filled 2019-03-26 (×2): qty 500

## 2019-03-26 MED ORDER — SERTRALINE HCL 50 MG PO TABS
100.0000 mg | ORAL_TABLET | Freq: Every day | ORAL | Status: DC
  Administered 2019-03-27 – 2019-03-28 (×2): 100 mg via ORAL
  Filled 2019-03-26 (×2): qty 2

## 2019-03-26 MED ORDER — SODIUM CHLORIDE 0.9 % IV SOLN
1.0000 g | Freq: Once | INTRAVENOUS | Status: AC
  Administered 2019-03-26: 1 g via INTRAVENOUS
  Filled 2019-03-26: qty 10

## 2019-03-26 MED ORDER — SODIUM CHLORIDE 0.9 % IV SOLN
1.0000 g | INTRAVENOUS | Status: DC
  Administered 2019-03-27: 1 g via INTRAVENOUS
  Filled 2019-03-26: qty 10
  Filled 2019-03-26: qty 1

## 2019-03-26 MED ORDER — SODIUM CHLORIDE 0.9 % IV SOLN
500.0000 mg | Freq: Once | INTRAVENOUS | Status: AC
  Administered 2019-03-26: 17:00:00 500 mg via INTRAVENOUS
  Filled 2019-03-26: qty 500

## 2019-03-26 MED ORDER — CALCIUM CARBONATE-VITAMIN D 500-200 MG-UNIT PO TABS
1.0000 | ORAL_TABLET | Freq: Every day | ORAL | Status: DC
  Administered 2019-03-27 – 2019-03-28 (×2): 1 via ORAL
  Filled 2019-03-26 (×2): qty 1

## 2019-03-26 MED ORDER — MOXIFLOXACIN HCL 0.5 % OP SOLN
1.0000 [drp] | Freq: Two times a day (BID) | OPHTHALMIC | Status: DC
  Administered 2019-03-27 – 2019-03-28 (×4): 1 [drp] via OPHTHALMIC
  Filled 2019-03-26 (×2): qty 3

## 2019-03-26 MED ORDER — ONDANSETRON HCL 4 MG PO TABS: 4.0000 mg | ORAL_TABLET | Freq: Four times a day (QID) | ORAL | Status: DC | PRN

## 2019-03-26 MED ORDER — ENOXAPARIN SODIUM 40 MG/0.4ML ~~LOC~~ SOLN
40.0000 mg | SUBCUTANEOUS | Status: DC
  Administered 2019-03-27: 01:00:00 40 mg via SUBCUTANEOUS
  Filled 2019-03-26: qty 0.4

## 2019-03-26 MED ORDER — ASPIRIN 81 MG PO CHEW
81.0000 mg | CHEWABLE_TABLET | Freq: Every day | ORAL | Status: DC
  Administered 2019-03-27 – 2019-03-28 (×2): 81 mg via ORAL
  Filled 2019-03-26 (×2): qty 1

## 2019-03-26 MED ORDER — POLYETHYLENE GLYCOL 3350 17 G PO PACK: 17.0000 g | PACK | Freq: Every day | ORAL | Status: DC | PRN

## 2019-03-26 MED ORDER — ONDANSETRON HCL 4 MG/2ML IJ SOLN: 4.0000 mg | Freq: Four times a day (QID) | INTRAMUSCULAR | Status: DC | PRN

## 2019-03-26 MED ORDER — ACETAMINOPHEN 325 MG PO TABS: 650.0000 mg | ORAL_TABLET | Freq: Four times a day (QID) | ORAL | Status: DC | PRN

## 2019-03-26 MED ORDER — ACETAMINOPHEN 650 MG RE SUPP: 650.0000 mg | Freq: Four times a day (QID) | RECTAL | Status: DC | PRN

## 2019-03-26 MED ORDER — SODIUM CHLORIDE 0.9 % IV SOLN
INTRAVENOUS | Status: AC
  Administered 2019-03-27: 01:00:00 via INTRAVENOUS

## 2019-03-26 NOTE — H&P (Addendum)
[employer] [facility] [facility] [place] at [facility]   PATIENT NAME: Angelica Collins    MR#:  [id number]  DATE OF BIRTH:  [birth year]  DATE OF ADMISSION:  03/26/2019  PRIMARY CARE PHYSICIAN: Patient, No Pcp Per   REQUESTING/REFERRING PHYSICIAN: Sharman Cheek, MD  CHIEF COMPLAINT:   Chief Complaint  Patient presents with  . Altered Mental Status    HISTORY OF PRESENT ILLNESS:  Nocole Collins  is a 84 y.o. female with a known history of hyperlipidemia, mitral valve prolapse, Raynaud's, dementia presented to the ED from [facility] due to altered mental status.  Patient states that she was told she was "acting funny".  She states that she feels completely fine.  She endorses cough for the last week.  The cough is dry.  She denies any fevers or chills.  She denies any dysuria, urinary urgency, urinary frequency.  She denies any chest pain, nausea, vomiting.  In the ED, she was meeting sepsis criteria with tachypnea and leukocytosis.  Labs were significant for WBC 18.1.  UA was negative.  X-ray showed left upper lung pneumonia.  She was started on empiric antibiotics.  Hospitalists were called for admission.  PAST MEDICAL HISTORY:   Past Medical History:  Diagnosis Date  . Cognitive impairment   . Dementia (HCC)   . Geographic tongue   . Hypercholesteremia   . Hyperlipidemia   . Nonrheumatic mitral (valve) prolapse   . Raynaud's syndrome without gangrene     PAST SURGICAL HISTORY:  History reviewed. No pertinent surgical history.  SOCIAL HISTORY:   Social History   Tobacco Use  . Smoking status: Never Smoker  . Smokeless tobacco: Never Used  Substance Use Topics  . Alcohol use: No    FAMILY HISTORY:  Cousin-heart disease  DRUG ALLERGIES:  No Known Allergies  REVIEW OF SYSTEMS:   Review of Systems  Constitutional: Negative for chills and fever.  HENT: Negative for congestion and sore throat.   Eyes: Negative for blurred vision and double vision.  Respiratory:  Positive for cough. Negative for sputum production and shortness of breath.   Cardiovascular: Negative for chest pain and palpitations.  Gastrointestinal: Negative for nausea and vomiting.  Genitourinary: Negative for dysuria and urgency.  Musculoskeletal: Negative for back pain and neck pain.  Neurological: Negative for dizziness and headaches.  Psychiatric/Behavioral: Negative for depression. The patient is not nervous/anxious.     MEDICATIONS AT HOME:   Prior to Admission medications   Medication Sig Start Date End Date Taking? Authorizing Provider  acetaminophen (TYLENOL) 325 MG tablet Take 650 mg by mouth every 4 (four) hours. 05/31/18  Yes [provider]  aspirin 81 MG chewable tablet Chew 81 mg by mouth daily.   Yes [provider]  atorvastatin (LIPITOR) 10 MG tablet Take 10 mg by mouth daily.   Yes [provider]  calcium carbonate (OS-CAL) 600 MG tablet Take 1 tablet by mouth daily.   Yes [provider]  moxifloxacin (VIGAMOX) 0.5 % ophthalmic solution Place 1 drop into both eyes 2 (two) times a day. 03/24/19 03/29/19 Yes [provider]  ondansetron (ZOFRAN ODT) 8 MG disintegrating tablet Take 1 tablet (8 mg total) by mouth every 8 (eight) hours as needed. 05/14/17  Yes Payton Mccallum, MD  Polyethylene Glycol 3350 POWD 17 g daily as needed (constipation).    Yes [provider]  sertraline (ZOLOFT) 100 MG tablet Take 100 mg by mouth daily. 01/18/19  Yes [provider]  VITAL SIGNS:  Blood pressure 139/69, pulse 92, temperature 98.6 F (37 C), temperature source Oral, resp. rate (!) 30, height 5\' 4"  (1.626 m), weight 54.4 kg, SpO2 97 %.  PHYSICAL EXAMINATION:  Physical Exam  GENERAL:  84 y.o.-year-old patient lying in the bed with no acute distress.  EYES: Pupils equal, round, reactive to light and accommodation. No scleral icterus. Extraocular muscles intact.  HEENT: Head atraumatic, normocephalic.  Oropharynx and nasopharynx clear.  NECK:  Supple, no jugular venous distention. No thyroid enlargement, no tenderness.  LUNGS: + Coarse breath sounds in the left mid to upper lung field. no use of accessory muscles of respiration.  CARDIOVASCULAR: Tachycardic, regular rhythm, S1, S2 normal. No murmurs, rubs, or gallops.  ABDOMEN: Soft, nontender, nondistended. Bowel sounds present. No organomegaly or mass.  EXTREMITIES: No pedal edema, cyanosis, or clubbing.  NEUROLOGIC: Cranial nerves II through XII are intact. Muscle strength 5/5 in all extremities. Sensation intact. Gait not checked.  PSYCHIATRIC: The patient is alert and oriented x 3.  SKIN: No obvious rash, lesion, or ulcer.   LABORATORY PANEL:   CBC Recent Labs  Lab 03/26/19 1338  WBC 18.1*  HGB 13.0  HCT 40.2  PLT 323   ------------------------------------------------------------------------------------------------------------------  Chemistries  Recent Labs  Lab 03/26/19 1338  NA 138  K 4.1  CL 98  CO2 26  GLUCOSE 135*  BUN 19  CREATININE 0.64  CALCIUM 8.9  AST 33  ALT 20  ALKPHOS 115  BILITOT 0.6   ------------------------------------------------------------------------------------------------------------------  Cardiac Enzymes Recent Labs  Lab 03/26/19 1338  TROPONINI <0.03   ------------------------------------------------------------------------------------------------------------------  RADIOLOGY:  Dg Chest 1 View  Result Date: 03/26/2019 CLINICAL DATA:  84 year old with cough.  Leukocytosis. EXAM: CHEST  1 VIEW COMPARISON:  None. FINDINGS: Curvature of the thoracic spine towards the right. Coarse lung markings are suggestive for chronic changes but concern for focal airspace disease in the left upper lung. Heart size is normal. Atherosclerotic calcifications at the aortic arch. IMPRESSION: 1. Airspace disease in the left upper lung. Findings are concerning for pneumonia. 2. Coarse interstitial lung  markings throughout both lungs could represent chronic changes. Atypical infection cannot be excluded. 3. Scoliosis. Electronically Signed   By: Richarda OverlieAdam  Henn M.D.   On: 03/26/2019 15:57      IMPRESSION AND PLAN:   Sepsis secondary to community-acquired pneumonia- meeting sepsis criteria on admission with tachypnea and leukocytosis.  Chest x-ray with left upper lobe airspace disease. -Continue ceftriaxone and azithromycin -Check lactic acid -Check procalcitonin -Check RVP and COVID -Blood and urine cultures pending -IVFs  Hyperlipidemia-stable -Continue home Lipitor  Hyperglycemia- no diagnosis of diabetes -Check A1c  Depression-stable -Continue home Zoloft  Dementia- patient initially with AMS, but this resolved on my exam -Supportive care  DVT prophylaxis-Lovenox  All the records are reviewed and case discussed with ED provider. Management plans discussed with the patient, family and they are in agreement.  CODE STATUS: DNR  TOTAL TIME TAKING CARE OF THIS PATIENT: 45 minutes.    Jinny BlossomKaty D Mayo M.D on 03/26/2019 at 6:31 PM  Between 7am to 6pm - Pager - [phone]  After 6pm go to [email or web address] - Social research officer, governmentpassword [email or web address]  [facility] [place] [facility]  Office  [phone]  CC: Primary care physician; Patient, No Pcp Per   Note: This dictation was prepared with Dragon dictation along with smaller phrase technology. Any transcriptional errors that result from this process are unintentional.

## 2019-03-26 NOTE — ED Triage Notes (Signed)
Pt arrived to ED from [facility] via [facility] with c/c of altered mental status. EMS reports that pt has suspected UTI. Transport vitals reported as 153/69. P100, cbg 132, O2 sat 95%, temp 99.1. Pt reported as oriented to name only. Upon arrival, Pt A&Ox3, NAD, no respiratory distress evident. Dr Mayford Knife at bedside.

## 2019-03-26 NOTE — ED Provider Notes (Signed)
[facility] Emergency Department Provider Note       Time seen: ----------------------------------------- 1:43 PM on 03/26/2019 ----------------------------------------- Level V caveat: History/ROS limited by altered mental status  I have reviewed the triage vital signs and the nursing notes.  HISTORY   Chief Complaint Altered Mental Status    HPI Angelica Collins is a 84 y.o. female with a history of dementia, hyperlipidemia who presents to the ED for altered renal status.  Patient arrives from [place] with altered mental status and a suspected urinary tract infection.  Vital signs were unremarkable in route.  She is oriented to name only.  Cannot give further review of systems or report.  Past Medical History:  Diagnosis Date  . Cognitive impairment   . Dementia (HCC)   . Geographic tongue   . Hypercholesteremia   . Hyperlipidemia   . Nonrheumatic mitral (valve) prolapse   . Raynaud's syndrome without gangrene     There are no active problems to display for this patient.   History reviewed. No pertinent surgical history.  Allergies Patient has no known allergies.  Social History Social History   Tobacco Use  . Smoking status: Never Smoker  . Smokeless tobacco: Never Used  Substance Use Topics  . Alcohol use: No  . Drug use: No    Review of Systems Unknown at this time  All systems negative/normal/unremarkable except as stated in the HPI  ____________________________________________   PHYSICAL EXAM:  VITAL SIGNS: ED Triage Vitals  Enc Vitals Group     BP 03/26/19 1341 (!) 142/82     Pulse Rate 03/26/19 1341 95     Resp 03/26/19 1341 (!) 30     Temp 03/26/19 1341 98.6 F (37 C)     Temp Source 03/26/19 1341 Oral     SpO2 03/26/19 1341 96 %     Weight 03/26/19 1331 120 lb (54.4 kg)     Height 03/26/19 1331 5\' 4"  (1.626 m)     Head Circumference --      Peak Flow --      Pain Score --      Pain Loc --      Pain Edu? --       Excl. in GC? --     Constitutional: Alert but disoriented.  No distress Eyes: Conjunctivae are normal. Normal extraocular movements. Cardiovascular: Normal rate, regular rhythm. No murmurs, rubs, or gallops. Respiratory: Normal respiratory effort without tachypnea nor retractions. Breath sounds are clear and equal bilaterally. No wheezes/rales/rhonchi. Gastrointestinal: Soft and nontender. Normal bowel sounds Musculoskeletal: Nontender with normal range of motion in extremities. No lower extremity tenderness nor edema. Neurologic:  Normal speech and language. No gross focal neurologic deficits are appreciated.  Skin:  Skin is warm, dry and intact. No rash noted. Psychiatric: Mood and affect are normal. Speech and behavior are normal.  ____________________________________________  ED COURSE:  As part of my medical decision making, I reviewed the following data within the electronic MEDICAL RECORD NUMBER History obtained from family if available, nursing notes, old chart and ekg, as well as notes from prior ED visits. Patient presented for altered mental status and suspected UTI, we will assess with labs and imaging as indicated at this time.   Procedures  Angelica Collins was evaluated in Emergency Department on 03/26/2019 for the symptoms described in the history of present illness. She was evaluated in the context of the global COVID-19 pandemic, which necessitated consideration that the patient might be at risk for  infection with the SARS-CoV-2 virus that causes COVID-19. Institutional protocols and algorithms that pertain to the evaluation of patients at risk for COVID-19 are in a state of rapid change based on information released by regulatory bodies including the CDC and federal and state organizations. These policies and algorithms were followed during the patient's care in the ED.  ____________________________________________   LABS (pertinent positives/negatives)  Labs Reviewed  CBC  WITH DIFFERENTIAL/PLATELET - Abnormal; Notable for the following components:      Result Value   WBC 18.1 (*)    Neutro Abs 16.2 (*)    Abs Immature Granulocytes 0.16 (*)    All other components within normal limits  COMPREHENSIVE METABOLIC PANEL - Abnormal; Notable for the following components:   Glucose, Bld 135 (*)    Albumin 3.1 (*)    All other components within normal limits  TROPONIN I  URINALYSIS, COMPLETE (UACMP) WITH MICROSCOPIC  CBG MONITORING, ED  ____________________________________________   DIFFERENTIAL DIAGNOSIS   Dehydration, electrolyte abnormality, occult infection, medication side effect, dementia  FINAL ASSESSMENT AND PLAN  Altered mental status   Plan: The patient had presented for altered mental status.  She arrives alert and in no acute distress, does not know why she is here.  Patient's labs did reveal leukocytosis but urinalysis is pending at this time.    Ulice DashJohnathan E Kamaury Cutbirth, MD    Note: This note was generated in part or whole with voice recognition software. Voice recognition is usually quite accurate but there are transcription errors that can and very often do occur. I apologize for any typographical errors that were not detected and corrected.     Emily FilbertWilliams, Duran Ohern E, MD 03/26/19 1504

## 2019-03-26 NOTE — Progress Notes (Signed)
Family Meeting Note  Advance Directive:yes  Today a meeting took place with the Patient.  Patient is able to participate.  The following clinical team members were present during this meeting:MD  The following were discussed:Patient's diagnosis: sepsis 2/2 CAP, Patient's progosis: Unable to determine and Goals for treatment: DNR  Additional follow-up to be provided: prn  Time spent during discussion:20 minutes  Angelica Sinclair, MD

## 2019-03-26 NOTE — ED Provider Notes (Signed)
-----------------------------------------   5:17 PM on 03/26/2019 -----------------------------------------  Chest x-ray reveals an infiltrate consistent with pneumonia.  With her leukocytosis and tachypnea, I think she warrants at least overnight observation to ensure that she does not have any respiratory deterioration through the acute phase of her illness.  IV ceftriaxone and azithromycin given.  Low risk for COVID-19 given the chest x-ray infiltrate, leukocytosis and lack of lymphopenia, absent fever, no known exposure.   Sharman Cheek, MD 03/26/19 [phone]

## 2019-03-26 NOTE — ED Notes (Addendum)
ED TO INPATIENT HANDOFF REPORT  ED Nurse Name and Phone #:   Elijah Birk RN  [phone]  S Name/Age/Gender Angelica Collins 84 y.o. female Room/Bed: ED05A/ED05A  Code Status   Code Status: Not on file  Home/SNF/Other Nursing Home Patient oriented to: self, place, time and situation Is this baseline? Yes   Triage Complete: Triage complete  Chief Complaint possible uti  Triage Note Pt arrived to ED from [facility] via [facility] with c/c of altered mental status. EMS reports that pt has suspected UTI. Transport vitals reported as 153/69. P100, cbg 132, O2 sat 95%, temp 99.1. Pt reported as oriented to name only. Upon arrival, Pt A&Ox3, NAD, no respiratory distress evident. Dr Mayford Knife at bedside.   Allergies No Known Allergies  Level of Care/Admitting Diagnosis ED Disposition    ED Disposition Condition Comment   Admit  Hospital Area: [facility] [[id number]]  Level of Care: Med-Surg [16]  Covid Evaluation: Person Under Investigation (PUI)  Isolation Risk Level: Low Risk  (Less than 4L [employer] supplementation)  Diagnosis: Sepsis [facility]) [[id number]]  Admitting Physician: Willadean Carol DODD [[id number]]  Attending Physician: Willadean Carol DODD [[id number]]  Estimated length of stay: past midnight tomorrow  Certification:: I certify this patient will need inpatient services for at least 2 midnights  PT Class (Do Not Modify): Inpatient [101]  PT Acc Code (Do Not Modify): Private [1]       B Medical/Surgery History Past Medical History:  Diagnosis Date  . Cognitive impairment   . Dementia (HCC)   . Geographic tongue   . Hypercholesteremia   . Hyperlipidemia   . Nonrheumatic mitral (valve) prolapse   . Raynaud's syndrome without gangrene    History reviewed. No pertinent surgical history.   A IV Location/Drains/Wounds Patient Lines/Drains/Airways Status   Active Line/Drains/Airways    Name:   Placement date:   Placement time:   Site:   Days:   Peripheral IV 03/26/19 Right  Antecubital   03/26/19    1336    Antecubital   less than 1   Peripheral IV 03/26/19 Left Antecubital   03/26/19    1951    Antecubital   less than 1          Intake/Output Last 24 hours  Intake/Output Summary (Last 24 hours) at 03/26/2019 2203 Last data filed at 03/26/2019 1749 Gross per 24 hour  Intake 349.27 ml  Output -  Net 349.27 ml    Labs/Imaging Results for orders placed or performed during the hospital encounter of 03/26/19 (from the past 48 hour(s))  CBC with Differential     Status: Abnormal   Collection Time: 03/26/19  1:38 PM  Result Value Ref Range   WBC 18.1 (H) 4.0 - 10.5 K/uL   RBC 4.62 3.87 - 5.11 MIL/uL   Hemoglobin 13.0 12.0 - 15.0 g/dL   HCT 69.6 29.5 - 28.4 %   MCV 87.0 80.0 - 100.0 fL   MCH 28.1 26.0 - 34.0 pg   MCHC 32.3 30.0 - 36.0 g/dL   RDW 13.2 44.0 - 10.2 %   Platelets 323 150 - 400 K/uL   nRBC 0.0 0.0 - 0.2 %   Neutrophils Relative % 89 %   Neutro Abs 16.2 (H) 1.7 - 7.7 K/uL   Lymphocytes Relative 5 %   Lymphs Abs 0.8 0.7 - 4.0 K/uL   Monocytes Relative 5 %   Monocytes Absolute 0.9 0.1 - 1.0 K/uL   Eosinophils Relative 0 %   Eosinophils  Absolute 0.0 0.0 - 0.5 K/uL   Basophils Relative 0 %   Basophils Absolute 0.0 0.0 - 0.1 K/uL   Immature Granulocytes 1 %   Abs Immature Granulocytes 0.16 (H) 0.00 - 0.07 K/uL    Comment: Performed at [facility], [address] [place], [place] [address]  Comprehensive metabolic panel     Status: Abnormal   Collection Time: 03/26/19  1:38 PM  Result Value Ref Range   Sodium 138 135 - 145 mmol/L   Potassium 4.1 3.5 - 5.1 mmol/L    Comment: HEMOLYSIS AT THIS LEVEL MAY AFFECT RESULT   Chloride 98 98 - 111 mmol/L   CO2 26 22 - 32 mmol/L   Glucose, Bld 135 (H) 70 - 99 mg/dL   BUN 19 8 - 23 mg/dL   Creatinine, Ser 3.15 0.44 - 1.00 mg/dL   Calcium 8.9 8.9 - 94.5 mg/dL   Total Protein 7.8 6.5 - 8.1 g/dL   Albumin 3.1 (L) 3.5 - 5.0 g/dL   AST 33 15 - 41 U/L   ALT 20 0 - 44 U/L   Alkaline  Phosphatase 115 38 - 126 U/L   Total Bilirubin 0.6 0.3 - 1.2 mg/dL   GFR calc non Af Amer >60 >60 mL/min   GFR calc Af Amer >60 >60 mL/min   Anion gap 14 5 - 15    Comment: Performed at [facility], [address] [place], [place] [address]  Troponin I - ONCE - STAT     Status: None   Collection Time: 03/26/19  1:38 PM  Result Value Ref Range   Troponin I <0.03 <0.03 ng/mL    Comment: Performed at [facility], [address] [place], [place] [address]  Urinalysis, Complete w Microscopic     Status: Abnormal   Collection Time: 03/26/19  3:03 PM  Result Value Ref Range   Color, Urine AMBER (A) YELLOW    Comment: BIOCHEMICALS MAY BE AFFECTED BY COLOR   APPearance CLEAR (A) CLEAR   Specific Gravity, Urine 1.028 1.005 - 1.030   pH 5.0 5.0 - 8.0   Glucose, UA NEGATIVE NEGATIVE mg/dL   Hgb urine dipstick NEGATIVE NEGATIVE   Bilirubin Urine NEGATIVE NEGATIVE   Ketones, ur 5 (A) NEGATIVE mg/dL   Protein, ur 863 (A) NEGATIVE mg/dL   Nitrite NEGATIVE NEGATIVE   Leukocytes,Ua NEGATIVE NEGATIVE   RBC / HPF 0-5 0 - 5 RBC/hpf   WBC, UA NONE SEEN 0 - 5 WBC/hpf   Bacteria, UA NONE SEEN NONE SEEN   Squamous Epithelial / LPF 0-5 0 - 5   Mucus PRESENT     Comment: Performed at [facility], [address] [place], [place] [address]  Glucose, capillary     Status: Abnormal   Collection Time: 03/26/19  3:10 PM  Result Value Ref Range   Glucose-Capillary 100 (H) 70 - 99 mg/dL  Lactic acid, plasma     Status: None   Collection Time: 03/26/19  7:49 PM  Result Value Ref Range   Lactic Acid, Venous 0.9 0.5 - 1.9 mmol/L    Comment: Performed at [facility], [address] [place], [place] [address]  SARS Coronavirus 2 [facility] order, Performed in [facility] hospital lab)     Status: None   Collection Time: 03/26/19  7:50 PM  Result Value Ref Range   SARS Coronavirus 2 NEGATIVE NEGATIVE    Comment: (NOTE) If result is NEGATIVE SARS-CoV-2 target  nucleic acids  are NOT DETECTED. The SARS-CoV-2 RNA is generally detectable in upper and lower  respiratory specimens during the acute phase of infection. The lowest  concentration of SARS-CoV-2 viral copies this assay can detect is 250  copies / mL. A negative result does not preclude SARS-CoV-2 infection  and should not be used as the sole basis for treatment or other  patient management decisions.  A negative result may occur with  improper specimen collection / handling, submission of specimen other  than nasopharyngeal swab, presence of viral mutation(s) within the  areas targeted by this assay, and inadequate number of viral copies  (<250 copies / mL). A negative result must be combined with clinical  observations, patient history, and epidemiological information. If result is POSITIVE SARS-CoV-2 target nucleic acids are DETECTED. The SARS-CoV-2 RNA is generally detectable in upper and lower  respiratory specimens dur ing the acute phase of infection.  Positive  results are indicative of active infection with SARS-CoV-2.  Clinical  correlation with patient history and other diagnostic information is  necessary to determine patient infection status.  Positive results do  not rule out bacterial infection or co-infection with other viruses. If result is PRESUMPTIVE POSTIVE SARS-CoV-2 nucleic acids MAY BE PRESENT.   A presumptive positive result was obtained on the submitted specimen  and confirmed on repeat testing.  While 2019 novel coronavirus  (SARS-CoV-2) nucleic acids may be present in the submitted sample  additional confirmatory testing may be necessary for epidemiological  and / or clinical management purposes  to differentiate between  SARS-CoV-2 and other Sarbecovirus currently known to infect humans.  If clinically indicated additional testing with an alternate test  methodology [phone] is advised. The SARS-CoV-2 RNA is generally  detectable in upper and lower  respiratory sp ecimens during the acute  phase of infection. The expected result is Negative. Fact Sheet for Patients:  BoilerBrush.com.cyhttps://www.fda.gov/media/136312/download Fact Sheet for Healthcare Providers: https://pope.com/https://www.fda.gov/media/136313/download This test is not yet approved or cleared by the [place] FDA and has been authorized for detection and/or diagnosis of SARS-CoV-2 by FDA under an Emergency Use Authorization (EUA).  This EUA will remain in effect (meaning this test can be used) for the duration of the COVID-19 declaration under Section 564(b)(1) of the Act, 21 U.S.C. section 360bbb-3(b)(1), unless the authorization is terminated or revoked sooner. Performed at [facility], [address] [place], [place] [address]    Dg Chest 1 View  Result Date: 03/26/2019 CLINICAL DATA:  84 year old with cough.  Leukocytosis. EXAM: CHEST  1 VIEW COMPARISON:  None. FINDINGS: Curvature of the thoracic spine towards the right. Coarse lung markings are suggestive for chronic changes but concern for focal airspace disease in the left upper lung. Heart size is normal. Atherosclerotic calcifications at the aortic arch. IMPRESSION: 1. Airspace disease in the left upper lung. Findings are concerning for pneumonia. 2. Coarse interstitial lung markings throughout both lungs could represent chronic changes. Atypical infection cannot be excluded. 3. Scoliosis. Electronically Signed   By: Richarda OverlieAdam  Henn M.D.   On: 03/26/2019 15:57    Pending Labs Unresulted Labs (From admission, onward)    Start     Ordered   03/26/19 1844  CULTURE, BLOOD (ROUTINE X 2) w Reflex to ID Panel  BLOOD CULTURE X 2,   STAT     03/26/19 1843   03/26/19 1518  Urine Culture  Add-on,   AD    Question:  Patient immune status  Answer:  Normal   03/26/19 1517  Signed and Held  Basic metabolic panel  Tomorrow morning,   R     Signed and Held   Signed and Held  CBC  Tomorrow morning,   R     Signed and Held   Signed and Held   Respiratory Panel by PCR  (Respiratory virus panel with precautions)  Once,   R    Question:  Patient immune status  Answer:  Normal   Signed and Held   Signed and Held  Procalcitonin - Baseline  ONCE - STAT,   STAT     Signed and Held   Signed and Held  Hemoglobin A1c  Tomorrow morning,   R     Signed and Held          Vitals/Pain Today's Vitals   03/26/19 1730 03/26/19 2022 03/26/19 2030 03/26/19 2130  BP: 139/69 134/70 (!) 142/41 126/90  Pulse: 92 88 86 86  Resp: (!) 30 (!) 29 (!) 28 (!) 22  Temp:      TempSrc:      SpO2: 97% 96% 95% 96%  Weight:      Height:      PainSc:  0-No pain  0-No pain    Isolation Precautions No active isolations  Medications Medications  cefTRIAXone (ROCEPHIN) 1 g in sodium chloride 0.9 % 100 mL IVPB (0 g Intravenous Stopped 03/26/19 1635)  azithromycin (ZITHROMAX) 500 mg in sodium chloride 0.9 % 250 mL IVPB (0 mg Intravenous Stopped 03/26/19 1749)    Mobility walks with person assist High fall risk   Focused Assessments Cardiac Assessment Handoff:    Lab Results  Component Value Date   TROPONINI <0.03 03/26/2019   No results found for: DDIMER Does the Patient currently have chest pain? No     R Recommendations: See Admitting Provider Note  Report given to:   Mathis Dad.  RN  Additional Notes:

## 2019-03-27 LAB — CBC
HCT: 36.3 % (ref 36.0–46.0)
Hemoglobin: 11.4 g/dL — ABNORMAL LOW (ref 12.0–15.0)
MCH: 28.2 pg (ref 26.0–34.0)
MCHC: 31.4 g/dL (ref 30.0–36.0)
MCV: 89.9 fL (ref 80.0–100.0)
Platelets: 270 10*3/uL (ref 150–400)
RBC: 4.04 MIL/uL (ref 3.87–5.11)
RDW: 14.9 % (ref 11.5–15.5)
WBC: 13 10*3/uL — ABNORMAL HIGH (ref 4.0–10.5)
nRBC: 0 % (ref 0.0–0.2)

## 2019-03-27 LAB — RESPIRATORY PANEL BY PCR

## 2019-03-27 LAB — BASIC METABOLIC PANEL
Anion gap: 12 (ref 5–15)
BUN: 18 mg/dL (ref 8–23)
CO2: 24 mmol/L (ref 22–32)
Calcium: 7.7 mg/dL — ABNORMAL LOW (ref 8.9–10.3)
Chloride: 104 mmol/L (ref 98–111)
Creatinine, Ser: 0.48 mg/dL (ref 0.44–1.00)
GFR calc Af Amer: >60 mL/min (ref >60)
GFR calc non Af Amer: >60 mL/min (ref >60)
Glucose, Bld: 98 mg/dL (ref 70–99)
Potassium: 3.4 mmol/L — ABNORMAL LOW (ref 3.5–5.1)
Sodium: 140 mmol/L (ref 135–145)

## 2019-03-27 LAB — MRSA PCR SCREENING: MRSA by PCR: NEGATIVE

## 2019-03-27 LAB — URINE CULTURE
Culture: NO GROWTH
Special Requests: NORMAL

## 2019-03-27 LAB — HEMOGLOBIN A1C
Hgb A1c MFr Bld: 5.8 % — ABNORMAL HIGH (ref 4.8–5.6)
Mean Plasma Glucose: 119.76 mg/dL

## 2019-03-27 MED ORDER — BLISTEX MEDICATED EX OINT
TOPICAL_OINTMENT | CUTANEOUS | Status: DC | PRN
  Filled 2019-03-27: qty 6.3

## 2019-03-27 MED ORDER — NYSTATIN 100000 UNIT/ML MT SUSP
5.0000 mL | Freq: Four times a day (QID) | OROMUCOSAL | Status: DC
  Administered 2019-03-27 – 2019-03-28 (×5): 500000 [IU] via ORAL
  Filled 2019-03-27 (×5): qty 5

## 2019-03-27 MED ORDER — POTASSIUM CHLORIDE 20 MEQ PO PACK: 40.0000 meq | PACK | Freq: Once | ORAL | Status: DC

## 2019-03-27 MED ORDER — POTASSIUM CHLORIDE CRYS ER 20 MEQ PO TBCR
40.0000 meq | EXTENDED_RELEASE_TABLET | Freq: Once | ORAL | Status: AC
  Administered 2019-03-27: 14:00:00 40 meq via ORAL
  Filled 2019-03-27: qty 2

## 2019-03-27 NOTE — Plan of Care (Signed)
  Problem: Education: Goal: Knowledge of General Education information will improve Description Including pain rating scale, medication(s)/side effects and non-pharmacologic comfort measures Outcome: Progressing   Problem: Health Behavior/Discharge Planning: Goal: Ability to manage health-related needs will improve Outcome: Progressing   Problem: Clinical Measurements: Goal: Ability to maintain clinical measurements within normal limits will improve Outcome: Progressing Goal: Will remain free from infection Outcome: Progressing Goal: Diagnostic test results will improve Outcome: Progressing Goal: Respiratory complications will improve Outcome: Progressing Goal: Cardiovascular complication will be avoided Outcome: Progressing   Problem: Activity: Goal: Risk for activity intolerance will decrease Outcome: Progressing   Problem: Nutrition: Goal: Adequate nutrition will be maintained Outcome: Progressing   Problem: Coping: Goal: Level of anxiety will decrease Outcome: Progressing   Problem: Elimination: Goal: Will not experience complications related to bowel motility Outcome: Progressing Goal: Will not experience complications related to urinary retention Outcome: Progressing   Problem: Pain Managment: Goal: General experience of comfort will improve Outcome: Progressing   Problem: Safety: Goal: Ability to remain free from injury will improve Outcome: Progressing   Problem: Skin Integrity: Goal: Risk for impaired skin integrity will decrease Outcome: Progressing  Forgetful.  Problem: Education: Goal: Knowledge of General Education information will improve Description Including pain rating scale, medication(s)/side effects and non-pharmacologic comfort measures Outcome: Progressing   Problem: Health Behavior/Discharge Planning: Goal: Ability to manage health-related needs will improve Outcome: Progressing   Problem: Clinical Measurements: Goal: Ability to  maintain clinical measurements within normal limits will improve Outcome: Progressing Goal: Will remain free from infection Outcome: Progressing Goal: Diagnostic test results will improve Outcome: Progressing Goal: Respiratory complications will improve Outcome: Progressing Goal: Cardiovascular complication will be avoided Outcome: Progressing   Problem: Activity: Goal: Risk for activity intolerance will decrease Outcome: Progressing   Problem: Nutrition: Goal: Adequate nutrition will be maintained Outcome: Progressing   Problem: Coping: Goal: Level of anxiety will decrease Outcome: Progressing   Problem: Elimination: Goal: Will not experience complications related to bowel motility Outcome: Progressing Goal: Will not experience complications related to urinary retention Outcome: Progressing   Problem: Pain Managment: Goal: General experience of comfort will improve Outcome: Progressing   Problem: Safety: Goal: Ability to remain free from injury will improve Outcome: Progressing   Problem: Skin Integrity: Goal: Risk for impaired skin integrity will decrease Outcome: Progressing

## 2019-03-27 NOTE — TOC Initial Note (Signed)
Transition of Care [facility]) - Initial/Assessment Note    Patient Details  Name: Angelica Collins MRN: [id number] Date of Birth: 08-13-1935  Transition of Care [facility]) CM/SW Contact:    Su Hilt, RN Phone Number: 03/27/2019, 12:31 PM  Clinical Narrative:                 Met with patient and spoke to Niece Kerney Elbe [phone] on the phone The patient currently lives at [facility] and uses a walker however she falls often, she has grab bars and a shower chair as well She has Doctors making house calls Dr. Keturah Barre Gets medications thru the facility She has had [facility] services in the past with [facility] and wants to use them again, I notified Helene Kelp with [facility] on the phone and she accepted the patient Transportation is provided by the facility or by The niece Opal Sidles when needed The plan is to return to [facility] when she discharges    Expected Discharge Plan: Assisted Living Barriers to Discharge: Continued Medical Work up   Patient Goals and CMS Choice Patient states their goals for this hospitalization and ongoing recovery are:: get better CMS Medicare.gov Compare Post Acute Care list provided to:: Patient Represenative (must comment)(Niece Kerney Elbe [phone])    Expected Discharge Plan and Services Expected Discharge Plan: Assisted Living   Discharge Planning Services: CM Consult Post Acute Care Choice: Home Health, Durable Medical Equipment(walker, shower chair, grab bars) Living arrangements for the past 2 months: Assisted Living Facility([facility])                 DME Arranged: (no needs)         HH Arranged: PT HH Agency: [facility] [facility] [employer] Date [place]: 03/27/19 Time [place]: 1230 Representative spoke with at [place] Arrangements/Services Living arrangements for the past 2 months: [place]) Lives with:: [facility]) Patient language and need for interpreter reviewed:: No Do you feel safe going back to the place where you live?: Yes      Need for Family Participation in Patient Care: No (Comment) Care giver support system in place?: Yes (comment)   Criminal Activity/Legal Involvement Pertinent to Current Situation/Hospitalization: No - Comment as needed  Activities of Daily Living Home Assistive Devices/Equipment: Environmental consultant (specify type) ADL Screening (condition at time of admission) Patient's cognitive ability adequate to safely complete daily activities?: Yes Is the patient deaf or have difficulty hearing?: No Does the patient have difficulty seeing, even when wearing glasses/contacts?: No Does the patient have difficulty concentrating, remembering, or making decisions?: No Patient able to express need for assistance with ADLs?: Yes Does the patient have difficulty dressing or bathing?: No Independently performs ADLs?: Yes (appropriate for developmental age) Does the patient have difficulty walking or climbing stairs?: Yes Weakness of Legs: None Weakness of Arms/Hands: None  Permission Sought/Granted Permission sought to share information with : Case Manager, Chartered certified accountant granted to share information with : Yes, Verbal Permission Granted     Permission granted to share info w AGENCY: [facility] [employer]        Emotional Assessment Appearance:: Appears stated age Attitude/Demeanor/Rapport: Engaged Affect (typically observed): Accepting Orientation: : Oriented to Self, Oriented to Place, Oriented to  Time Alcohol / Substance Use: Never Used Psych Involvement: No (comment)  Admission diagnosis:  Altered mental status, unspecified altered mental status type [R41.82] Community acquired pneumonia  of left upper lobe of lung ([place]) [J18.1] Patient Active Problem List   Diagnosis Date Noted  . Sepsis ([place]) 03/26/2019   PCP:  System, Pcp  Not In Pharmacy:   [facility], [place] [address] [place] [address] Phone: [phone] Fax: [phone]     Social Determinants of Health (SDOH) Interventions    Readmission Risk Interventions No flowsheet data found.

## 2019-03-27 NOTE — Evaluation (Signed)
Physical Therapy Evaluation Patient Details Name: Angelica Collins MRN: [id number] DOB: 1935/04/21 Today's Date: 03/27/2019   History of Present Illness  84 y.o. female with a known history of hyperlipidemia, mitral valve prolapse, Raynaud's, dementia presented to the ED from [facility] due to altered mental status. Admitted with sepsis, pneumonia.  Clinical Impression  Pt initially unsure about working with PT much, but ultimately agreed to do a little bit.  At baseline she is at [facility] and apparently uses 4WW for most mobility, she has very choppy/shuffling cadence with poor posture and poor overall ability to follow gait training instructions/cues.  Pt appears to be near her likely baseline, but has plenty that she can improve on regarding mobility and safety, recommending PT once she returns home.     Follow Up Recommendations Home health PT(at [facility])    Equipment Recommendations  None recommended by PT    Recommendations for Other Services       Precautions / Restrictions Precautions Precautions: Fall Restrictions Weight Bearing Restrictions: No      Mobility  Bed Mobility Overal bed mobility: Modified Independent             General bed mobility comments: Pt showed reasonably good mobility getting to EOB w/o direct assist  Transfers Overall transfer level: Modified independent Equipment used: Rolling walker (2 wheeled)             General transfer comment: Pt was able to rise to standing with light cuing for set up and walker use, she was able to maintain standing balance with poor stooped posture  Ambulation/Gait Ambulation/Gait assistance: Min guard Gait Distance (Feet): 60 Feet Assistive device: Rolling walker (2 wheeled)       General Gait Details: Pt with very stooped, shuffling, inefficent gait.  Cuing for improved step length, cadence and walker use with minimal lasting effect, she reports she has had PT in the past and they  have tried to work with her on this.   Stairs            Wheelchair Mobility    Modified Rankin (Stroke Patients Only)       Balance Overall balance assessment: Modified Independent                                           Pertinent Vitals/Pain Pain Assessment: No/denies pain    Home Living Family/patient expects to be discharged to:: Assisted living               Home Equipment: Walker - 4 wheels Additional Comments: At [facility]    Prior Function Level of Independence: Independent         Comments: Pt states that "before this virus stuff" she was walking to dining area, etc     Hand Dominance        Extremity/Trunk Assessment   Upper Extremity Assessment Upper Extremity Assessment: Overall WFL for tasks assessed    Lower Extremity Assessment Lower Extremity Assessment: Overall WFL for tasks assessed       Communication   Communication: No difficulties  Cognition Arousal/Alertness: Awake/alert Behavior During Therapy: Restless Overall Cognitive Status: History of cognitive impairments - at baseline  General Comments: Unsure of baseline, but per notes more clear of mind today - and appropriate with most interaction      General Comments      Exercises     Assessment/Plan    PT Assessment Patient needs continued PT services  PT Problem List Decreased strength;Decreased range of motion;Decreased activity tolerance;Decreased balance;Decreased mobility;Decreased coordination;Decreased knowledge of use of DME;Decreased safety awareness;Decreased cognition       PT Treatment Interventions DME instruction;Gait training;Functional mobility training;Therapeutic exercise;Therapeutic activities;Neuromuscular re-education;Balance training;Cognitive remediation;Patient/family education    PT Goals (Current goals can be found in the Care Plan section)  Acute Rehab PT  Goals Patient Stated Goal: get out of the hospital PT Goal Formulation: With patient Time For Goal Achievement: 04/10/19 Potential to Achieve Goals: Good    Frequency Min 2X/week   Barriers to discharge        Co-evaluation               AM-PAC PT "6 Clicks" Mobility  Outcome Measure Help needed turning from your back to your side while in a flat bed without using bedrails?: None Help needed moving from lying on your back to sitting on the side of a flat bed without using bedrails?: None Help needed moving to and from a bed to a chair (including a wheelchair)?: A Little Help needed standing up from a chair using your arms (e.g., wheelchair or bedside chair)?: A Little Help needed to walk in hospital room?: A Little Help needed climbing 3-5 steps with a railing? : A Lot 6 Click Score: 19    End of Session Equipment Utilized During Treatment: Gait belt Activity Tolerance: Patient tolerated treatment well Patient left: with chair alarm set;with call bell/phone within reach Nurse Communication: Mobility status PT Visit Diagnosis: Muscle weakness (generalized) (M62.81);Difficulty in walking, not elsewhere classified (R26.2)    Time: [id number] PT Time Calculation (min) (ACUTE ONLY): 29 min   Charges:   PT Evaluation $PT Eval Low Complexity: 1 Low PT Treatments $Gait Training: 8-22 mins       Malachi ProGalen R Derika Eckles, DPT 03/27/2019, 4:18 PM

## 2019-03-27 NOTE — Progress Notes (Signed)
[facility] -  at [facility]   PATIENT NAME: Angelica Collins    MR#:  [id number]  DATE OF BIRTH:  08/04/1935  SUBJECTIVE:   Chief Complaint  Patient presents with  . Altered Mental Status   Patient state she feels much better today " something just threw me off balance yesterday".  REVIEW OF SYSTEMS:  Review of Systems  Constitutional: Negative for chills, fever, malaise/fatigue and weight loss.  HENT: Negative for congestion, hearing loss and sore throat.   Eyes: Negative for blurred vision and double vision.  Respiratory: Negative for cough, shortness of breath and wheezing.   Cardiovascular: Negative for chest pain, palpitations, orthopnea and leg swelling.  Gastrointestinal: Negative for abdominal pain, diarrhea, nausea and vomiting.  Genitourinary: Negative for dysuria and urgency.  Musculoskeletal: Negative for myalgias.  Skin: Negative for rash.  Neurological: Negative for dizziness, sensory change, speech change, focal weakness and headaches.  Psychiatric/Behavioral: Negative for depression.    DRUG ALLERGIES:  No Known Allergies VITALS:  Blood pressure (!) 129/51, pulse 90, temperature (!) 97.5 F (36.4 C), temperature source Oral, resp. rate 20, height 5\' 4"  (1.626 m), weight 54.4 kg, SpO2 98 %. PHYSICAL EXAMINATION:   GENERAL:  84 y.o.-year-old patient lying in the bed with no acute distress.  EYES: Pupils equal, round, reactive to light and accommodation. No scleral icterus. Extraocular muscles intact.  HEENT: Head atraumatic, normocephalic. Oropharynx and nasopharynx clear.  NECK:  Supple, no jugular venous distention. No thyroid enlargement, no tenderness.  LUNGS: + Coarse breath sounds in the left mid to upper lung field. no use of accessory muscles of respiration.  CARDIOVASCULAR: Tachycardic, regular rhythm, S1, S2 normal. No murmurs, rubs, or gallops.  ABDOMEN: Soft, nontender, nondistended. Bowel sounds present. No organomegaly or  mass.  EXTREMITIES: No pedal edema, cyanosis, or clubbing.  NEUROLOGIC: Cranial nerves II through XII are intact. Muscle strength 5/5 in all extremities. Sensation intact. Gait not checked.  PSYCHIATRIC: The patient is alert and oriented x 3.  SKIN: No obvious rash, lesion, or ulcer.  DATA REVIEWED:  LABORATORY PANEL:  Female CBC Recent Labs  Lab 03/27/19 0334  WBC 13.0*  HGB 11.4*  HCT 36.3  PLT 270   ------------------------------------------------------------------------------------------------------------------ Chemistries  Recent Labs  Lab 03/26/19 1338 03/27/19 0334  NA 138 140  K 4.1 3.4*  CL 98 104  CO2 26 24  GLUCOSE 135* 98  BUN 19 18  CREATININE 0.64 0.48  CALCIUM 8.9 7.7*  AST 33  --   ALT 20  --   ALKPHOS 115  --   BILITOT 0.6  --    RADIOLOGY:  Dg Chest 1 View  Result Date: 03/26/2019 CLINICAL DATA:  84 year old with cough.  Leukocytosis. EXAM: CHEST  1 VIEW COMPARISON:  None. FINDINGS: Curvature of the thoracic spine towards the right. Coarse lung markings are suggestive for chronic changes but concern for focal airspace disease in the left upper lung. Heart size is normal. Atherosclerotic calcifications at the aortic arch. IMPRESSION: 1. Airspace disease in the left upper lung. Findings are concerning for pneumonia. 2. Coarse interstitial lung markings throughout both lungs could represent chronic changes. Atypical infection cannot be excluded. 3. Scoliosis. Electronically Signed   By: Richarda OverlieAdam  Henn M.D.   On: 03/26/2019 15:57   ASSESSMENT AND PLAN:   84 y.o. female with known history of hyperlipidemia, mitral valve prolapse, Raynaud's, dementia [facility] to altered mental status.  1. Sepsis secondary to community-acquired pneumonia-  meeting sepsis criteria on admission with tachypnea and leukocytosis.  Chest x-ray with left upper lobe airspace disease. Procalcitonin and Lactic acid within normal range -Continue ceftriaxone and  azithromycin -SARS Coronavirus 2 negative -Blood cultures no growth so far -Urine cultures pending -IVFs  2. Altered mental status- seems to be improving may likely be due to underlying infectious process as above in a patient with hx of underlying dementia. -Will treat underlying infection as above -Avoid benzos  3. Hyperlipidemia-stable -Continue home Lipitor  4. Hyperglycemia- no diagnosis of diabetes -Hgb A1c  5.8  5. Depression-stable -Continue home Zoloft  6 Dementia- patient initially with AMS, but this resolved on my exam -Supportive care  7. Oral thrush - will treat with Nystatin  8. DVT prophylaxis-Lovenox   All the records are reviewed and case discussed with Care Management/Social Worker. Management plans discussed with the patient, family and they are in agreement.  CODE STATUS: DNR  TOTAL TIME TAKING CARE OF THIS PATIENT: 38 minutes.   More than 50% of the time was spent in counseling/coordination of care: YES  POSSIBLE D/C IN 1 DAYS, DEPENDING ON CLINICAL CONDITION.   on 03/27/2019 at 1:13 PM  This patient was staffed with Dr. Enid Baas, Jude who personally evaluated patient, reviewed documentation and agreed with assessment and plan of care as above.  Webb Silversmith, DNP, FNP-BC [facility] Nurse Practitioner   Between 7am to 6pm - Pager [phone]  After 6pm go to [email or web address] - Social research officer, government  [facility]  Office  [phone]  CC: Primary care physician; System, Pcp Not In  Note: This dictation was prepared with Dragon dictation along with smaller phrase technology. Any transcriptional errors that result from this process are unintentional.

## 2019-03-27 NOTE — Progress Notes (Addendum)
NT notified RN of elevated BP of 132/92. Pt stated earlier when performing ascultation of heart and lungs she has white coat syndrome. Pt asymptomatic. Will continue to monitor.

## 2019-03-28 LAB — CBC WITH DIFFERENTIAL/PLATELET
Abs Immature Granulocytes: 0.46 10*3/uL — ABNORMAL HIGH (ref 0.00–0.07)
Basophils Absolute: 0 10*3/uL (ref 0.0–0.1)
Basophils Relative: 0 %
Eosinophils Absolute: 0.1 10*3/uL (ref 0.0–0.5)
Eosinophils Relative: 1 %
HCT: 34.8 % — ABNORMAL LOW (ref 36.0–46.0)
Hemoglobin: 11 g/dL — ABNORMAL LOW (ref 12.0–15.0)
Immature Granulocytes: 4 %
Lymphocytes Relative: 9 %
Lymphs Abs: 1.2 10*3/uL (ref 0.7–4.0)
MCH: 28.3 pg (ref 26.0–34.0)
MCHC: 31.6 g/dL (ref 30.0–36.0)
MCV: 89.5 fL (ref 80.0–100.0)
Monocytes Absolute: 0.9 10*3/uL (ref 0.1–1.0)
Monocytes Relative: 7 %
Neutro Abs: 10.3 10*3/uL — ABNORMAL HIGH (ref 1.7–7.7)
Neutrophils Relative %: 79 %
Platelets: 301 10*3/uL (ref 150–400)
RBC: 3.89 MIL/uL (ref 3.87–5.11)
RDW: 15.1 % (ref 11.5–15.5)
WBC: 13 10*3/uL — ABNORMAL HIGH (ref 4.0–10.5)
nRBC: 0 % (ref 0.0–0.2)

## 2019-03-28 LAB — BASIC METABOLIC PANEL
Anion gap: 9 (ref 5–15)
BUN: 13 mg/dL (ref 8–23)
CO2: 23 mmol/L (ref 22–32)
Calcium: 8 mg/dL — ABNORMAL LOW (ref 8.9–10.3)
Chloride: 108 mmol/L (ref 98–111)
Creatinine, Ser: 0.45 mg/dL (ref 0.44–1.00)
GFR calc Af Amer: >60 mL/min (ref >60)
GFR calc non Af Amer: >60 mL/min (ref >60)
Glucose, Bld: 101 mg/dL — ABNORMAL HIGH (ref 70–99)
Potassium: 3.9 mmol/L (ref 3.5–5.1)
Sodium: 140 mmol/L (ref 135–145)

## 2019-03-28 LAB — MAGNESIUM: Magnesium: 2.2 mg/dL (ref 1.7–2.4)

## 2019-03-28 MED ORDER — AZITHROMYCIN 500 MG PO TABS: 500.0000 mg | ORAL_TABLET | Freq: Once | ORAL | 0 refills | Status: AC

## 2019-03-28 MED ORDER — AZITHROMYCIN 500 MG PO TABS
500.0000 mg | ORAL_TABLET | Freq: Once | ORAL | Status: AC
  Administered 2019-03-28: 500 mg via ORAL
  Filled 2019-03-28: qty 1

## 2019-03-28 MED ORDER — MAGNESIUM CITRATE PO SOLN: 0.5000 | Freq: Once | ORAL | 0 refills | Status: AC

## 2019-03-28 MED ORDER — SENNOSIDES-DOCUSATE SODIUM 8.6-50 MG PO TABS
1.0000 | ORAL_TABLET | Freq: Two times a day (BID) | ORAL | Status: DC
  Administered 2019-03-28: 15:00:00 1 via ORAL

## 2019-03-28 MED ORDER — MAGNESIUM CITRATE PO SOLN
0.5000 | Freq: Once | ORAL | Status: AC
  Administered 2019-03-28: 15:00:00 0.5 via ORAL
  Filled 2019-03-28: qty 296

## 2019-03-28 MED ORDER — NYSTATIN 100000 UNIT/ML MT SUSP: 5.0000 mL | Freq: Four times a day (QID) | OROMUCOSAL | 0 refills | Status: DC

## 2019-03-28 NOTE — TOC Progression Note (Signed)
Transition of Care [facility]) - Progression Note    Patient Details  Name: Tanell Wittstruck MRN: [id number] Date of Birth: Jul 23, 1935  Transition of Care [facility]) CM/SW Contact  Barrie Dunker, RN Phone Number: 03/28/2019, 2:29 PM  Clinical Narrative:    Shanda Bumps the wellness director for assisted living in [place] has stated that they can take the patient back today with EMS transport   Expected Discharge Plan: Assisted Living Barriers to Discharge: Continued Medical Work up  Expected Discharge Plan and Services Expected Discharge Plan: Assisted Living   Discharge Planning Services: CM Consult Post Acute Care Choice: Home Health, Durable Medical Equipment(walker, shower chair, grab bars) Living arrangements for the past 2 months: Assisted Living Facility([place])                 DME Arranged: (no needs)         HH Arranged: PT HH Agency: [facility] Date HH Agency Contacted: 03/27/19 Time HH Agency Contacted: 1230 Representative spoke with at [facility] Agency: Rosey Bath   Social Determinants of Health (SDOH) Interventions    Readmission Risk Interventions No flowsheet data found.

## 2019-03-28 NOTE — Discharge Instructions (Signed)
Community-Acquired Pneumonia, Adult  Pneumonia is an infection of the lungs. It causes swelling in the airways of the lungs. Mucus and fluid may also build up inside the airways.  One type of pneumonia can happen while a person is in a hospital. A different type can happen when a person is not in a hospital (community-acquired pneumonia).   What are the causes?    This condition is caused by germs (viruses, bacteria, or fungi). Some types of germs can be passed from one person to another. This can happen when you breathe in droplets from the cough or sneeze of an infected person.  What increases the risk?  You are more likely to develop this condition if you:   Have a long-term (chronic) disease, such as:  ? Chronic obstructive pulmonary disease (COPD).  ? Asthma.  ? Cystic fibrosis.  ? Congestive heart failure.  ? Diabetes.  ? Kidney disease.   Have HIV.   Have sickle cell disease.   Have had your spleen removed.   Do not take good care of your teeth and mouth (poor dental hygiene).   Have a medical condition that increases the risk of breathing in droplets from your own mouth and nose.   Have a weakened body defense system (immune system).   Are a smoker.   Travel to areas where the germs that cause this illness are common.   Are around certain animals or the places they live.  What are the signs or symptoms?   A dry cough.   A wet (productive) cough.   Fever.   Sweating.   Chest pain. This often happens when breathing deeply or coughing.   Fast breathing or trouble breathing.   Shortness of breath.   Shaking chills.   Feeling tired (fatigue).   Muscle aches.  How is this treated?  Treatment for this condition depends on many things. Most adults can be treated at home. In some cases, treatment must happen in a hospital. Treatment may include:   Medicines given by mouth or through an IV tube.   Being given extra oxygen.   Respiratory therapy.  In rare cases, treatment for very bad pneumonia  may include:   Using a machine to help you breathe.   Having a procedure to remove fluid from around your lungs.  Follow these instructions at home:  Medicines   Take over-the-counter and prescription medicines only as told by your doctor.  ? Only take cough medicine if you are losing sleep.   If you were prescribed an antibiotic medicine, take it as told by your doctor. Do not stop taking the antibiotic even if you start to feel better.  General instructions     Sleep with your head and neck raised (elevated). You can do this by sleeping in a recliner or by putting a few pillows under your head.   Rest as needed. Get at least 8 hours of sleep each night.   Drink enough water to keep your pee (urine) pale yellow.   Eat a healthy diet that includes plenty of vegetables, fruits, whole grains, low-fat dairy products, and lean protein.   Do not use any products that contain nicotine or tobacco. These include cigarettes, e-cigarettes, and chewing tobacco. If you need help quitting, ask your doctor.   Keep all follow-up visits as told by your doctor. This is important.  How is this prevented?  A shot (vaccine) can help prevent pneumonia. Shots are often suggested for:   People   older than 82 years of age.   People older than 82 years of age who:  ? Are having cancer treatment.  ? Have long-term (chronic) lung disease.  ? Have problems with their body's defense system.  You may also prevent pneumonia if you take these actions:   Get the flu (influenza) shot every year.   Go to the dentist as often as told.   Wash your hands often. If you cannot use soap and water, use hand sanitizer.  Contact a doctor if:   You have a fever.   You lose sleep because your cough medicine does not help.  Get help right away if:   You are short of breath and it gets worse.   You have more chest pain.   Your sickness gets worse. This is very serious if:  ? You are an older adult.  ? Your body's defense system is weak.   You  cough up blood.  Summary   Pneumonia is an infection of the lungs.   Most adults can be treated at home. Some will need treatment in a hospital.   Drink enough water to keep your pee pale yellow.   Get at least 8 hours of sleep each night.  This information is not intended to replace advice given to you by your health care provider. Make sure you discuss any questions you have with your health care provider.  Document Released: 05/08/2008 Document Revised: 07/18/2018 Document Reviewed: 07/18/2018  Elsevier Interactive Patient Education  2019 [employer]

## 2019-03-28 NOTE — TOC Progression Note (Signed)
Transition of Care [facility]) - Progression Note    Patient Details  Name: Angelica Collins MRN: [id number] Date of Birth: June 17, 1935  Transition of Care [facility]) CM/SW Contact  Barrie Dunker, RN Phone Number: 03/28/2019, 2:22 PM  Clinical Narrative:     Spoke to Nurse at [facility] and she stated that yes the patient can come back to assisted living  Expected Discharge Plan: Assisted Living Barriers to Discharge: Continued Medical Work up  Expected Discharge Plan and Services Expected Discharge Plan: Assisted Living   Discharge Planning Services: CM Consult Post Acute Care Choice: Home Health, Durable Medical Equipment(walker, shower chair, grab bars) Living arrangements for the past 2 months: Assisted Living Facility([place])                 DME Arranged: (no needs)         HH Arranged: PT HH Agency: [facility] Date HH Agency Contacted: 03/27/19 Time HH Agency Contacted: 1230 Representative spoke with at [facility] Agency: Rosey Bath   Social Determinants of Health (SDOH) Interventions    Readmission Risk Interventions No flowsheet data found.

## 2019-03-28 NOTE — TOC Transition Note (Signed)
Transition of Care [facility]) - CM/SW Discharge Note   Patient Details  Name: Angelica Collins MRN: [id number] Date of Birth: Sep 27, 1935  Transition of Care [facility]) CM/SW Contact:  Barrie Dunker, RN Phone Number: 03/28/2019, 2:30 PM   Clinical Narrative:     Patient to DC back to [facility] assisted living via EMS transport, Niece Erskine Squibb made aware by NP Shanda Bumps the Director has verified that they can take the patient back today, FL2 and DC summary to be placed in DC packet on chart  Final next level of care: Assisted Living([place]) Barriers to Discharge: Barriers Resolved   Patient Goals and CMS Choice Patient states their goals for this hospitalization and ongoing recovery are:: get better CMS Medicare.gov Compare Post Acute Care list provided to:: Patient Represenative (must comment)(Niece Blima Singer [phone])    Discharge Placement                       Discharge Plan and Services   Discharge Planning Services: CM Consult Post Acute Care Choice: Home Health, Durable Medical Equipment(walker, shower chair, grab bars)          DME Arranged: (no needs)         HH Arranged: PT HH Agency: [facility] Date HH Agency Contacted: 03/27/19 Time HH Agency Contacted: 1230 Representative spoke with at [facility] Agency: Rosey Bath  Social Determinants of Health (SDOH) Interventions     Readmission Risk Interventions No flowsheet data found.

## 2019-03-28 NOTE — Plan of Care (Signed)

## 2019-03-28 NOTE — Discharge Summary (Signed)
[employer] [facility] [place] at [facility]   PATIENT NAME: Adamary Savary    MR#:  [id number]  DATE OF BIRTH:  [birth year]  DATE OF ADMISSION:  03/26/2019   ADMITTING PHYSICIAN: Campbell Stall, MD  DATE OF DISCHARGE: 03/28/19  PRIMARY CARE PHYSICIAN: System, Pcp Not In   ADMISSION DIAGNOSIS:   Altered mental status, unspecified altered mental status type [R41.82] Community acquired pneumonia of left upper lobe of lung (HCC) [J18.1]  DISCHARGE DIAGNOSIS:   Active Problems:   Sepsis (HCC)   SECONDARY DIAGNOSIS:   Past Medical History:  Diagnosis Date  . Cognitive impairment   . Dementia (HCC)   . Geographic tongue   . Hypercholesteremia   . Hyperlipidemia   . Nonrheumatic mitral (valve) prolapse   . Raynaud's syndrome without gangrene     HOSPITAL COURSE:   84 y.o. female with known history of hyperlipidemia, mitral valve prolapse,Raynaud's, dementia presented to the ED from [facility] due to altered mental status.  1. Sepsis secondary to community-acquired pneumonia-meeting sepsis criteria on admission with tachypnea and leukocytosis. Chest x-ray with left upper lobe airspace disease. Procalcitonin and Lactic acid within normal range -Continue ceftriaxone and azithromycin -SARS Coronavirus 2 negative -Blood cultures no growth so far -Patient tested negative for SARS-CoV-2 virus that causes COVID-19 as part of requirement prior to discharge to SNF.  2. Altered mental status- seems to be improving may likely be due to underlying infectious process as above in a patient with hx of underlying dementia. -Improved with tx  3. Hyperlipidemia-stable -Continue home Lipitor  4. Hyperglycemia- no diagnosis of diabetes -Hgb A1c  5.8  5. Depression-stable -Continue home Zoloft  6 Hx of Dementia- patient initially with AMS, but this resolved on my exam -Supportive care  7. Oral thrush - On Nystatin   DISCHARGE CONDITIONS:   stable CONSULTS  OBTAINED:    DRUG ALLERGIES:   No Known Allergies DISCHARGE MEDICATIONS:   Allergies as of 03/28/2019   No Known Allergies     Medication List    TAKE these medications   acetaminophen 325 MG tablet Commonly known as:  TYLENOL Take 650 mg by mouth every 4 (four) hours.   aspirin 81 MG chewable tablet Chew 81 mg by mouth daily.   atorvastatin 10 MG tablet Commonly known as:  LIPITOR Take 10 mg by mouth daily.   azithromycin 500 MG tablet Commonly known as:  ZITHROMAX Take 1 tablet (500 mg total) by mouth once for 1 dose.   calcium carbonate 600 MG tablet Commonly known as:  OS-CAL Take 1 tablet by mouth daily.   magnesium citrate Soln Take 148 mLs (0.5 Bottles total) by mouth once for 1 dose.   moxifloxacin 0.5 % ophthalmic solution Commonly known as:  VIGAMOX Place 1 drop into both eyes 2 (two) times a day.   nystatin 100000 UNIT/ML suspension Commonly known as:  MYCOSTATIN Take 5 mLs (500,000 Units total) by mouth 4 (four) times daily.   ondansetron 8 MG disintegrating tablet Commonly known as:  Zofran ODT Take 1 tablet (8 mg total) by mouth every 8 (eight) hours as needed.   Polyethylene Glycol 3350 Powd 17 g daily as needed (constipation).   sertraline 100 MG tablet Commonly known as:  ZOLOFT Take 100 mg by mouth daily.        DISCHARGE INSTRUCTIONS:    DIET:   Regular diet  ACTIVITY:   Activity as tolerated  OXYGEN:   Home Oxygen: No.  Oxygen Delivery: room  air  DISCHARGE LOCATION:   nursing home   If you experience worsening of your admission symptoms, develop shortness of breath, life threatening emergency, suicidal or homicidal thoughts you must seek medical attention immediately by calling 911 or calling your MD immediately  if symptoms less severe.  You Must read complete instructions/literature along with all the possible adverse reactions/side effects for all the Medicines you take and that have been prescribed to you. Take  any new Medicines after you have completely understood and accpet all the possible adverse reactions/side effects.   Please note  You were cared for by a hospitalist during your hospital stay. If you have any questions about your discharge medications or the care you received while you were in the hospital after you are discharged, you can call the unit and asked to speak with the hospitalist on call if the hospitalist that took care of you is not available. Once you are discharged, your primary care physician will handle any further medical issues. Please note that NO REFILLS for any discharge medications will be authorized once you are discharged, as it is imperative that you return to your primary care physician (or establish a relationship with a primary care physician if you do not have one) for your aftercare needs so that they can reassess your need for medications and monitor your lab values.    On the day of Discharge:  VITAL SIGNS:   Blood pressure (!) 143/69, pulse 84, temperature 97.9 F (36.6 C), resp. rate (!) 21, height 5\' 4"  (1.626 m), weight 54.4 kg, SpO2 95 %.  PHYSICAL EXAMINATION:    GENERAL:  84 y.o.-year-old patient lying in the bed with no acute distress.  EYES: Pupils equal, round, reactive to light and accommodation. No scleral icterus. Extraocular muscles intact.  HEENT: Head atraumatic, normocephalic. Oropharynx and nasopharynx clear.  NECK:  Supple, no jugular venous distention. No thyroid enlargement, no tenderness.  LUNGS: Normal breath sounds bilaterally, no wheezing, rales,rhonchi or crepitation. No use of accessory muscles of respiration.  CARDIOVASCULAR: S1, S2 normal. No murmurs, rubs, or gallops.  ABDOMEN: Soft, non-tender, non-distended. Bowel sounds present. No organomegaly or mass.  EXTREMITIES: No pedal edema, cyanosis, or clubbing.  NEUROLOGIC: Cranial nerves II through XII are intact. Muscle strength 5/5 in all extremities. Sensation intact. Gait not  checked.  PSYCHIATRIC: The patient is alert and oriented x 3.  SKIN: No obvious rash, lesion, or ulcer.   DATA REVIEW:   CBC Recent Labs  Lab 03/28/19 0354  WBC 13.0*  HGB 11.0*  HCT 34.8*  PLT 301    Chemistries  Recent Labs  Lab 03/26/19 1338  03/28/19 0354  NA 138   < > 140  K 4.1   < > 3.9  CL 98   < > 108  CO2 26   < > 23  GLUCOSE 135*   < > 101*  BUN 19   < > 13  CREATININE 0.64   < > 0.45  CALCIUM 8.9   < > 8.0*  MG  --   --  2.2  AST 33  --   --   ALT 20  --   --   ALKPHOS 115  --   --   BILITOT 0.6  --   --    < > = values in this interval not displayed.     Microbiology Results  Results for orders placed or performed during the hospital encounter of 03/26/19  Urine Culture  Status: None   Collection Time: 03/26/19  3:03 PM  Result Value Ref Range Status   Specimen Description   Final    URINE, CATHETERIZED Performed at [facility], [address], [place], [place] [address]    Special Requests   Final    Normal Performed at [facility], [address] [place], [place] [address]    Culture   Final    NO GROWTH Performed at [facility], [address], [place], [place] [address]    Report Status 03/27/2019 FINAL  Final  CULTURE, BLOOD (ROUTINE X 2) w Reflex to ID Panel     Status: None (Preliminary result)   Collection Time: 03/26/19  7:49 PM  Result Value Ref Range Status   Specimen Description BLOOD LEFT ANTECUBITAL  Final   Special Requests   Final    BOTTLES DRAWN AEROBIC AND ANAEROBIC Blood Culture adequate volume   Culture   Final    NO GROWTH 2 DAYS Performed at [facility], [address], [place], [place] [address]    Report Status PENDING  Incomplete  SARS Coronavirus 2 [facility] order, Performed in [facility] hospital lab)     Status: None   Collection Time: 03/26/19  7:50 PM  Result Value Ref Range Status   SARS Coronavirus 2 NEGATIVE NEGATIVE Final    Comment: (NOTE) If result is  NEGATIVE SARS-CoV-2 target nucleic acids are NOT DETECTED. The SARS-CoV-2 RNA is generally detectable in upper and lower  respiratory specimens during the acute phase of infection. The lowest  concentration of SARS-CoV-2 viral copies this assay can detect is 250  copies / mL. A negative result does not preclude SARS-CoV-2 infection  and should not be used as the sole basis for treatment or other  patient management decisions.  A negative result may occur with  improper specimen collection / handling, submission of specimen other  than nasopharyngeal swab, presence of viral mutation(s) within the  areas targeted by this assay, and inadequate number of viral copies  (<250 copies / mL). A negative result must be combined with clinical  observations, patient history, and epidemiological information. If result is POSITIVE SARS-CoV-2 target nucleic acids are DETECTED. The SARS-CoV-2 RNA is generally detectable in upper and lower  respiratory specimens dur ing the acute phase of infection.  Positive  results are indicative of active infection with SARS-CoV-2.  Clinical  correlation with patient history and other diagnostic information is  necessary to determine patient infection status.  Positive results do  not rule out bacterial infection or co-infection with other viruses. If result is PRESUMPTIVE POSTIVE SARS-CoV-2 nucleic acids MAY BE PRESENT.   A presumptive positive result was obtained on the submitted specimen  and confirmed on repeat testing.  While 2019 novel coronavirus  (SARS-CoV-2) nucleic acids may be present in the submitted sample  additional confirmatory testing may be necessary for epidemiological  and / or clinical management purposes  to differentiate between  SARS-CoV-2 and other Sarbecovirus currently known to infect humans.  If clinically indicated additional testing with an alternate test  methodology [phone]) is advised. The SARS-CoV-2 RNA is generally  detectable  in upper and lower respiratory sp ecimens during the acute  phase of infection. The expected result is Negative. Fact Sheet for Patients:  BoilerBrush.com.cy Fact Sheet for Healthcare Providers: https://pope.com/ This test is not yet approved or cleared by the [place] FDA and has been authorized for detection and/or diagnosis of SARS-CoV-2 by FDA  under an Emergency Use Authorization (EUA).  This EUA will remain in effect (meaning this test can be used) for the duration of the COVID-19 declaration under Section 564(b)(1) of the Act, 21 U.S.C. section 360bbb-3(b)(1), unless the authorization is terminated or revoked sooner. Performed at [facility], [address] [place], [place] [address]   Respiratory Panel by PCR     Status: None   Collection Time: 03/26/19  7:50 PM  Result Value Ref Range Status   Adenovirus NOT DETECTED NOT DETECTED Final   Coronavirus 229E NOT DETECTED NOT DETECTED Final    Comment: (NOTE) The Coronavirus on the Respiratory Panel, DOES NOT test for the novel  Coronavirus (2019 nCoV)    Coronavirus HKU1 NOT DETECTED NOT DETECTED Final   Coronavirus NL63 NOT DETECTED NOT DETECTED Final   Coronavirus OC43 NOT DETECTED NOT DETECTED Final   Metapneumovirus NOT DETECTED NOT DETECTED Final   Rhinovirus / Enterovirus NOT DETECTED NOT DETECTED Final   Influenza A NOT DETECTED NOT DETECTED Final   Influenza B NOT DETECTED NOT DETECTED Final   Parainfluenza Virus 1 NOT DETECTED NOT DETECTED Final   Parainfluenza Virus 2 NOT DETECTED NOT DETECTED Final   Parainfluenza Virus 3 NOT DETECTED NOT DETECTED Final   Parainfluenza Virus 4 NOT DETECTED NOT DETECTED Final   Respiratory Syncytial Virus NOT DETECTED NOT DETECTED Final   Bordetella pertussis NOT DETECTED NOT DETECTED Final   Chlamydophila pneumoniae NOT DETECTED NOT DETECTED Final   Mycoplasma pneumoniae NOT DETECTED NOT DETECTED Final    Comment:  Performed at [facility], [address], [place], [place] [address]  CULTURE, BLOOD (ROUTINE X 2) w Reflex to ID Panel     Status: None (Preliminary result)   Collection Time: 03/26/19 10:58 PM  Result Value Ref Range Status   Specimen Description BLOOD BLOOD RIGHT WRIST  Final   Special Requests   Final    BOTTLES DRAWN AEROBIC AND ANAEROBIC Blood Culture results may not be optimal due to an excessive volume of blood received in culture bottles   Culture   Final    NO GROWTH 2 DAYS Performed at [facility], [address], [place], [place] [address]    Report Status PENDING  Incomplete  MRSA PCR Screening     Status: None   Collection Time: 03/27/19  1:35 PM  Result Value Ref Range Status   MRSA by PCR NEGATIVE NEGATIVE Final    Comment:        The GeneXpert MRSA Assay (FDA approved for NASAL specimens only), is one component of a comprehensive MRSA colonization surveillance program. It is not intended to diagnose MRSA infection nor to guide or monitor treatment for MRSA infections. Performed at [facility], [address] [place], [place] [address]     RADIOLOGY:  No results found.   Management plans discussed with the patient, family and they are in agreement.  CODE STATUS:     Code Status Orders  (From admission, onward)         Start     Ordered   03/26/19 2245  Do not attempt resuscitation (DNR)  Continuous    Question Answer Comment  In the event of cardiac or respiratory ARREST Do not call a "code blue"   In the event of cardiac or respiratory ARREST Do not perform Intubation, CPR, defibrillation or ACLS   In the event of cardiac or respiratory ARREST Use medication by any route, position, wound care, and  other measures to relive pain and suffering. May use oxygen, suction and manual treatment of airway obstruction as needed for comfort.      03/26/19 2244        Code Status History    This patient has a current code  status but no historical code status.    Advance Directive Documentation     Most Recent Value  Type of Advance Directive  Out of facility DNR (pink MOST or yellow form)  Pre-existing out of facility DNR order (yellow form or pink MOST form)  Yellow form placed in chart (order not valid for inpatient use)  "MOST" Form in Place?  -      TOTAL TIME TAKING CARE OF THIS PATIENT: 36 minutes.     03/28/2019 at 2:40 PM   This patient was staffed with Dr. Enid Baasjie, Jude who personally evaluated patient, reviewed documentation and agreed with discharge plan of care as above.  Webb SilversmithElizabeth Currie Dennin, DNP, FNP-BC Hospitalist Nurse Practitioner  Between 7am to 6pm - Pager [phone]- [phone]  After 6pm go to [email or web address] - Social research officer, governmentpassword EPAS [facility]  [employer] [place] Hospitalists  Office  [phone]  CC: Primary care physician; System, Pcp Not In   Note: This dictation was prepared with Dragon dictation along with smaller phrase technology. Any transcriptional errors that result from this process are unintentional.

## 2019-03-28 NOTE — Progress Notes (Signed)
Called report to [facility]. Answered all questions. EMS called for transport.

## 2019-03-28 NOTE — NC FL2 (Signed)
[place] MEDICAID FL2 LEVEL OF CARE SCREENING TOOL     IDENTIFICATION  Patient Name: Angelica Collins Birthdate: 10-Aug-1935 Sex: female Admission Date (Current Location): 03/26/2019  [place] and [place] Number:  Chiropodist and Address:  [facility], [address] [place], [place] [address]      Provider Number: [id number]  Attending Physician Name and Address:  Jama Flavors, MD  Relative Name and Phone Number:  Erskine Squibb Niece [phone]    Current Level of Care: Hospital Recommended Level of Care: Assisted Living Facility Prior Approval Number:    Date Approved/Denied:   PASRR Number:    Discharge Plan: Other (Comment)(Assisted Living)    Current Diagnoses: Patient Active Problem List   Diagnosis Date Noted  . Sepsis (HCC) 03/26/2019    Orientation RESPIRATION BLADDER Height & Weight     Self, Time, Situation, Place  Normal Continent Weight: 54.4 kg Height:  5\' 4"  (162.6 cm)  BEHAVIORAL SYMPTOMS/MOOD NEUROLOGICAL BOWEL NUTRITION STATUS      Continent Diet(regular)  AMBULATORY STATUS COMMUNICATION OF NEEDS Skin   Limited Assist Verbally Normal                       Personal Care Assistance Level of Assistance  Dressing     Dressing Assistance: Limited assistance     Functional Limitations Info  Sight, Hearing, Speech Sight Info: Adequate Hearing Info: Adequate Speech Info: Adequate    SPECIAL CARE FACTORS FREQUENCY                       Contractures Contractures Info: Not present    Additional Factors Info  Code Status Code Status Info: DNR             Current Medications (03/28/2019):  This is the current hospital active medication list Current Facility-Administered Medications  Medication Dose Route Frequency Provider Last Rate Last Dose  . acetaminophen (TYLENOL) tablet 650 mg  650 mg Oral Q6H PRN Mayo, Allyn Kenner, MD       Or  . acetaminophen (TYLENOL) suppository 650 mg  650 mg Rectal Q6H PRN  Mayo, Allyn Kenner, MD      . aspirin chewable tablet 81 mg  81 mg Oral Daily Mayo, Allyn Kenner, MD   81 mg at 03/28/19 1102  . atorvastatin (LIPITOR) tablet 10 mg  10 mg Oral Daily Mayo, Allyn Kenner, MD   10 mg at 03/27/19 1748  . azithromycin (ZITHROMAX) tablet 500 mg  500 mg Oral Once Jimmye Norman, NP      . calcium-vitamin D (OSCAL WITH D) 500-200 MG-UNIT per tablet 1 tablet  1 tablet Oral Daily Mayo, Allyn Kenner, MD   1 tablet at 03/28/19 1102  . cefTRIAXone (ROCEPHIN) 1 g in sodium chloride 0.9 % 100 mL IVPB  1 g Intravenous Q24H Mayo, Allyn Kenner, MD 200 mL/hr at 03/27/19 1600 1 g at 03/27/19 1600  . enoxaparin (LOVENOX) injection 40 mg  40 mg Subcutaneous Q24H Mayo, Allyn Kenner, MD   40 mg at 03/27/19 0035  . lip balm (BLISTEX) ointment   Topical PRN Mansy, Jan A, MD      . magnesium citrate solution 0.5 Bottle  0.5 Bottle Oral Once Ouma, Hubbard Hartshorn, NP      . moxifloxacin (VIGAMOX) 0.5 % ophthalmic solution 1 drop  1 drop Both Eyes BID Mayo, Allyn Kenner, MD   1 drop at 03/28/19 1102  . nystatin (MYCOSTATIN) 100000 UNIT/ML  suspension 500,000 Units  5 mL Oral QID Jimmye Normanuma, Elizabeth Achieng, NP   500,000 Units at 03/28/19 1104  . ondansetron (ZOFRAN) tablet 4 mg  4 mg Oral Q6H PRN Mayo, Allyn KennerKaty Dodd, MD       Or  . ondansetron [facility](ZOFRAN) injection 4 mg  4 mg Intravenous Q6H PRN Mayo, Allyn KennerKaty Dodd, MD      . polyethylene glycol (MIRALAX / GLYCOLAX) packet 17 g  17 g Oral Daily PRN Mayo, Allyn KennerKaty Dodd, MD      . senna-docusate (Senokot-S) tablet 1 tablet  1 tablet Oral BID Jimmye Normanuma, Elizabeth Achieng, NP      . sertraline (ZOLOFT) tablet 100 mg  100 mg Oral Daily Mayo, Allyn KennerKaty Dodd, MD   100 mg at 03/28/19 1101     Discharge Medications: Please see discharge summary for a list of discharge medications.  Relevant Imaging Results:  Relevant Lab Results:   Additional Information    Barrie Dunkereliliah J Preeya Cleckley, RN

## 2019-03-28 NOTE — TOC Progression Note (Signed)
Transition of Care [facility]) - Progression Note    Patient Details  Name: Lajada Munir MRN: [id number] Date of Birth: 1935/04/17  Transition of Care [facility]) CM/SW Contact  Barrie Dunker, RN Phone Number: 03/28/2019, 12:33 PM  Clinical Narrative:    [facility], she will DC, The receptionist stated that they could take the patient back  but they wont pick her up, she then transferred my call to a vm, I left a VM requesting a call back    Expected Discharge Plan: Assisted Living Barriers to Discharge: Continued Medical Work up  Expected Discharge Plan and Services Expected Discharge Plan: Assisted Living   Discharge Planning Services: CM Consult Post Acute Care Choice: Home Health, Durable Medical Equipment(walker, shower chair, grab bars) Living arrangements for the past 2 months: Assisted Living Facility([facility])                 DME Arranged: (no needs)         HH Arranged: PT HH Agency: [facility] Date HH Agency Contacted: 03/27/19 Time HH Agency Contacted: 1230 Representative spoke with at [facility] Agency: Rosey Bath   Social Determinants of Health (SDOH) Interventions    Readmission Risk Interventions No flowsheet data found.

## 2019-03-28 NOTE — Care Management Important Message (Signed)
Important Message  Patient Details  Name: Angelica Collins MRN: [id number] Date of Birth: [birth year]   Medicare Important Message Given:  Yes    Barrie Dunker, RN 03/28/2019, 12:12 PM

## 2019-03-31 LAB — CULTURE, BLOOD (ROUTINE X 2)
Culture: NO GROWTH
Culture: NO GROWTH
Special Requests: ADEQUATE

## 2019-07-20 ENCOUNTER — Emergency Department
Admission: EM | Admit: 2019-07-20 | Discharge: 2019-07-20 | Disposition: A | Discharge status: Home / Self Care | Payer: Medicare Other | Attending: Emergency Medicine | Admitting: Emergency Medicine

## 2019-07-20 ENCOUNTER — Emergency Department: Payer: Medicare Other

## 2019-07-20 ENCOUNTER — Other Ambulatory Visit: Payer: Self-pay

## 2019-07-20 DIAGNOSIS — Z7982 Long term (current) use of aspirin: Secondary | ICD-10-CM | POA: Insufficient documentation

## 2019-07-20 DIAGNOSIS — S0990XA Unspecified injury of head, initial encounter: Secondary | ICD-10-CM | POA: Diagnosis present

## 2019-07-20 DIAGNOSIS — W010XXA Fall on same level from slipping, tripping and stumbling without subsequent striking against object, initial encounter: Secondary | ICD-10-CM | POA: Diagnosis not present

## 2019-07-20 DIAGNOSIS — S0101XA Laceration without foreign body of scalp, initial encounter: Secondary | ICD-10-CM | POA: Diagnosis not present

## 2019-07-20 DIAGNOSIS — Y92129 Unspecified place in nursing home as the place of occurrence of the external cause: Secondary | ICD-10-CM | POA: Diagnosis not present

## 2019-07-20 DIAGNOSIS — F0390 Unspecified dementia without behavioral disturbance: Secondary | ICD-10-CM | POA: Insufficient documentation

## 2019-07-20 DIAGNOSIS — Y999 Unspecified external cause status: Secondary | ICD-10-CM | POA: Diagnosis not present

## 2019-07-20 DIAGNOSIS — Y9301 Activity, walking, marching and hiking: Secondary | ICD-10-CM | POA: Diagnosis not present

## 2019-07-20 DIAGNOSIS — Z79899 Other long term (current) drug therapy: Secondary | ICD-10-CM | POA: Diagnosis not present

## 2019-07-20 LAB — GLUCOSE, CAPILLARY: Glucose-Capillary: 87 mg/dL (ref 70–99)

## 2019-07-20 MED ORDER — LIDOCAINE-EPINEPHRINE 2 %-1:100000 IJ SOLN
20.0000 mL | Freq: Once | INTRAMUSCULAR | Status: AC
  Administered 2019-07-20: 18:00:00 20 mL via INTRADERMAL
  Filled 2019-07-20: qty 1

## 2019-07-20 MED ORDER — LIDOCAINE HCL (PF) 1 % IJ SOLN
INTRAMUSCULAR | Status: AC
  Filled 2019-07-20: qty 5

## 2019-07-20 NOTE — Discharge Instructions (Addendum)
Please be seen for reevaluation by nursing, your doctor, or return to the ER in about 10 to 14 days for a wound check and to have your staples removed.  You had a total of 8 staples placed today

## 2019-07-20 NOTE — ED Provider Notes (Addendum)
[facility] Emergency Department Provider Note   ____________________________________________   First MD Initiated Contact with Patient 07/20/19 1648     (approximate)  I have reviewed the triage vital signs and the nursing notes.   HISTORY  Chief Complaint Fall and Head Laceration    HPI Angelica Collins is a 84 y.o. female here for injury to the back of the head  Patient reports she was running late for dinner, she was going quickly down the hallway to get to the dinner table, she slipped, fell backwards striking the back of her head on the floor.  Denies any injury to the neck.  Reports the back of her head is sore and it bled quite a lot she is sure she has a cut back that needs to be fixed  Did not lose consciousness.  No preceding symptoms.  No chest pain no shortness of breath.  Reports normal health, no injury to the arms legs or anywhere else.     Past Medical History:  Diagnosis Date  . Cognitive impairment   . Dementia (HCC)   . Geographic tongue   . Hypercholesteremia   . Hyperlipidemia   . Nonrheumatic mitral (valve) prolapse   . Raynaud's syndrome without gangrene     Patient Active Problem List   Diagnosis Date Noted  . Sepsis (HCC) 03/26/2019    History reviewed. No pertinent surgical history.  Prior to Admission medications   Medication Sig Start Date End Date Taking? Authorizing Provider  acetaminophen (TYLENOL) 325 MG tablet Take 650 mg by mouth every 4 (four) hours. 05/31/18  Yes [provider]  aspirin 81 MG chewable tablet Chew 81 mg by mouth daily.   Yes [provider]  atorvastatin (LIPITOR) 10 MG tablet Take 10 mg by mouth daily.   Yes [provider]  calcium-vitamin D (OSCAL WITH D) 500-200 MG-UNIT tablet Take 2 tablets by mouth daily.   Yes [provider]  Polyethylene Glycol 3350 POWD 17 g daily as needed (constipation).    Yes [provider]  sertraline (ZOLOFT) 100  MG tablet Take 100 mg by mouth daily. 01/18/19  Yes [provider]    Allergies Patient has no known allergies.  No family history on file.  Social History Social History   Tobacco Use  . Smoking status: Never Smoker  . Smokeless tobacco: Never Used  Substance Use Topics  . Alcohol use: No  . Drug use: No    Review of Systems **}Constitutional: No fever/chills or recent illness Eyes: No visual changes. ENT: No neck pain Cardiovascular: Denies chest pain. Respiratory: Denies shortness of breath. Gastrointestinal: No abdominal pain.   Musculoskeletal: Negative for back pain.  No neck pain.  No injury to the arms or legs. Skin: Negative for rash of her bleeding on the back of her scalp. Neurological: Negative for headaches, areas of focal weakness or numbness.  Except, she does report the back of her scalp is "sore"    ____________________________________________   PHYSICAL EXAM:  VITAL SIGNS: ED Triage Vitals  Enc Vitals Group     BP 07/20/19 1645 (!) 181/74     Pulse Rate 07/20/19 1645 88     Resp 07/20/19 1645 17     Temp 07/20/19 1645 98.5 F (36.9 C)     Temp Source 07/20/19 1645 Oral     SpO2 07/20/19 1645 99 %     Weight 07/20/19 1643 110 lb (49.9 kg)     Height 07/20/19  1643 5\' 4"  (1.626 m)     Head Circumference --      Peak Flow --      Pain Score 07/20/19 1643 0     Pain Loc --      Pain Edu? --      Excl. in [place]? --     Constitutional: Alert and oriented. Well appearing and in no acute distress.  Notable history of dementia, but she is alert oriented to fall and injury. Eyes: Conjunctivae are normal. Head: Atraumatic except for 1 inch laceration left posterior parietal region. Nose: No congestion/rhinnorhea. Mouth/Throat: Mucous membranes are moist. Neck: No stridor.  Full range of motion the neck without pain.  No cervical tenderness. Cardiovascular: Normal rate, regular rhythm. Grossly normal heart sounds.  Good peripheral circulation.  Respiratory: Normal respiratory effort.  No retractions. Lungs CTAB. Gastrointestinal: Soft and nontender. No distention. Musculoskeletal: No lower extremity tenderness nor edema. Neurologic:  Normal speech and language. No gross focal neurologic deficits are appreciated.  Skin:  Skin is warm, dry and intact. No rash noted. Psychiatric: Mood and affect are normal. Speech and behavior are normal.  ____________________________________________   LABS (all labs ordered are listed, but only abnormal results are displayed)  Labs Reviewed  CBG MONITORING, ED   ____________________________________________  EKG   ____________________________________________  RADIOLOGY  Ct Head Wo Contrast  Result Date: 07/20/2019 CLINICAL DATA:  Head trauma.  Laceration to the back of head. EXAM: CT HEAD WITHOUT CONTRAST TECHNIQUE: Contiguous axial images were obtained from the base of the skull through the vertex without intravenous contrast. COMPARISON:  CT dated May 25, 2017. FINDINGS: Brain: No evidence of acute infarction, hemorrhage, hydrocephalus, extra-axial collection or mass lesion/mass effect. Volume loss and chronic microvascular ischemic changes are noted. Vascular: No hyperdense vessel or unexpected calcification. Skull: Normal. Negative for fracture or focal lesion. There is posterior scalp swelling. There is evidence of a repaired posterior laceration. Sinuses/Orbits: No acute finding. The patient is status post bilateral cataract surgery. Other: None. IMPRESSION: 1. No acute intracranial abnormality detected. 2. Posterior scalp swelling with evidence for a repaired laceration. There is no associated underlying fracture. 3. Volume loss and chronic microvascular ischemic changes are again noted. Electronically Signed   By: Katherine Mantlehristopher  Green M.D.   On: 07/20/2019 18:23    CT head reviewed, negative for acute intracranial ____________________________________________   PROCEDURES   Procedure(s) performed: laceration    .Marland Kitchen.Laceration Repair  Date/Time: 07/20/2019 5:30 PM Performed by: Sharyn CreamerQuale, Caymen Dubray, MD Authorized by: Sharyn CreamerQuale, Amala Petion, MD   Consent:    Consent obtained:  Verbal   Consent given by:  Patient   Risks discussed:  Infection, pain and retained foreign body Anesthesia (see MAR for exact dosages):    Anesthesia method:  Local infiltration   Local anesthetic:  Lidocaine 2% WITH epi Laceration details:    Location:  Scalp   Length (cm):  2.5   Depth (mm):  4 Repair type:    Repair type:  Simple Exploration:    Hemostasis achieved with:  Direct pressure   Wound exploration: entire depth of wound probed and visualized     Contaminated: no   Treatment:    Area cleansed with:  Saline   Amount of cleaning:  Extensive   Irrigation solution:  Sterile saline   Irrigation volume:  250   Visualized foreign bodies/material removed: no   Skin repair:    Repair method:  Staples   Number of staples:  8 Approximation:    Approximation:  Close Post-procedure details:    Dressing:  Sterile dressing   Patient tolerance of procedure:  Tolerated well, no immediate complications    Critical Care performed: No  ____________________________________________   INITIAL IMPRESSION / ASSESSMENT AND PLAN / ED COURSE  Pertinent labs & imaging results that were available during my care of the patient were reviewed by me and considered in my medical decision making (see chart for details).   Patient reports mechanical fall.  No preceding symptoms.  No recent illness.  Injury to the back of the scalp, repaired by staples with excllent effect.  CT head negative for acute injury  Clinical Course as of Jul 20 1915  Sun Jul 20, 2019  1739 Patient alert, wound repaired with good effect.  She is very pleasant.  Awaiting CT the head   [MQ]  1914 POC Glucose    [MQ]  1916 Glucose 81   [MQ]    Clinical Course User Index [MQ] Delman Kitten, MD      ____________________________________________   FINAL CLINICAL IMPRESSION(S) / ED DIAGNOSES  Final diagnoses:  Closed head injury, initial encounter  Laceration of scalp, initial encounter        Note:  This document was prepared using Dragon voice recognition software and may include unintentional dictation errors       Delman Kitten, MD 07/20/19 Ilsa Iha    Delman Kitten, MD 07/20/19 1916

## 2019-07-20 NOTE — ED Notes (Signed)
CBG was 87.  MD notified.

## 2019-07-20 NOTE — ED Notes (Signed)
Attempted to call report on patient. Unable to reach anyone at [facility]. [phone]

## 2019-07-20 NOTE — ED Triage Notes (Signed)
Pt arrived via ems from [facility] for report of mechanical fall while going to super causing a laceration to the back of her head - pt denies loss of consciousness and denies any pain

## 2019-07-20 NOTE — ED Notes (Signed)
Patient transported to CT 

## 2019-11-25 ENCOUNTER — Emergency Department: Payer: Medicare Other

## 2019-11-25 ENCOUNTER — Emergency Department
Admission: EM | Admit: 2019-11-25 | Discharge: 2019-11-26 | Disposition: A | Discharge status: Home / Self Care | Payer: Medicare Other | Attending: Emergency Medicine | Admitting: Emergency Medicine

## 2019-11-25 ENCOUNTER — Other Ambulatory Visit: Payer: Self-pay

## 2019-11-25 DIAGNOSIS — W01190A Fall on same level from slipping, tripping and stumbling with subsequent striking against furniture, initial encounter: Secondary | ICD-10-CM | POA: Diagnosis not present

## 2019-11-25 DIAGNOSIS — Y939 Activity, unspecified: Secondary | ICD-10-CM | POA: Diagnosis not present

## 2019-11-25 DIAGNOSIS — Z7982 Long term (current) use of aspirin: Secondary | ICD-10-CM | POA: Diagnosis not present

## 2019-11-25 DIAGNOSIS — Z79899 Other long term (current) drug therapy: Secondary | ICD-10-CM | POA: Diagnosis not present

## 2019-11-25 DIAGNOSIS — Y999 Unspecified external cause status: Secondary | ICD-10-CM | POA: Diagnosis not present

## 2019-11-25 DIAGNOSIS — S0511XA Contusion of eyeball and orbital tissues, right eye, initial encounter: Secondary | ICD-10-CM | POA: Insufficient documentation

## 2019-11-25 DIAGNOSIS — Y929 Unspecified place or not applicable: Secondary | ICD-10-CM | POA: Diagnosis not present

## 2019-11-25 DIAGNOSIS — F0390 Unspecified dementia without behavioral disturbance: Secondary | ICD-10-CM | POA: Insufficient documentation

## 2019-11-25 DIAGNOSIS — S0990XA Unspecified injury of head, initial encounter: Secondary | ICD-10-CM

## 2019-11-25 NOTE — ED Notes (Signed)
ED provider at bedside.

## 2019-11-25 NOTE — ED Triage Notes (Signed)
Pt arrives [facility] from [facility]. She bent over to pick something up and hit R side of head on nightstand. No blood thinners. No LOC. Pt has hematoma noted to R eye/forehead. No lac noted. A&O. EMS reported pt is wheelchair bound but is able to stand and pivot when needed.

## 2019-11-26 NOTE — ED Notes (Signed)
Report called to [place] at [facility].

## 2019-11-27 NOTE — ED Provider Notes (Signed)
[facility] Emergency Department Provider Note ____________________________________________   First MD Initiated Contact with Patient 11/25/19 2307     (approximate)  I have reviewed the triage vital signs and the nursing notes.   HISTORY  Chief Complaint Head Injury  HPI Angelica Collins is a 84 y.o. female who presents to the emergency department for treatment and evaluation after a fall. She bent over to pick something up and lost her balance. She hit her face on the nightstand. No loss of consciousness. No laceration. She denies headache or vision change.      Past Medical History:  Diagnosis Date  . Cognitive impairment   . Dementia ([place])   . Geographic tongue   . Hypercholesteremia   . Hyperlipidemia   . Nonrheumatic mitral (valve) prolapse   . Raynaud's syndrome without gangrene     Patient Active Problem List   Diagnosis Date Noted  . Sepsis ([place]) 03/26/2019    History reviewed. No pertinent surgical history.  Prior to Admission medications   Medication Sig Start Date End Date Taking? Authorizing Provider  acetaminophen (TYLENOL) 325 MG tablet Take 650 mg by mouth every 4 (four) hours. 05/31/18   [provider]  aspirin 81 MG chewable tablet Chew 81 mg by mouth daily.    [provider]  atorvastatin (LIPITOR) 10 MG tablet Take 10 mg by mouth daily.    [provider]  calcium-vitamin D (OSCAL WITH D) 500-200 MG-UNIT tablet Take 2 tablets by mouth daily.    [provider]  Polyethylene Glycol 3350 POWD 17 g daily as needed (constipation).     [provider]  sertraline (ZOLOFT) 100 MG tablet Take 100 mg by mouth daily. 01/18/19   [provider]    Allergies Patient has no known allergies.  History reviewed. No pertinent family history.  Social History Social History   Tobacco Use  . Smoking status: Never Smoker  . Smokeless tobacco: Never Used  Substance Use Topics  . Alcohol  use: No  . Drug use: No    Review of Systems  Constitutional: No fever/chills Eyes: No visual changes. ENT: No sore throat. Cardiovascular: Denies chest pain. Respiratory: Denies shortness of breath. Gastrointestinal: No abdominal pain.  No nausea, no vomiting. Genitourinary: Negative for dysuria. Musculoskeletal: Negative for back pain. Skin: Positive for contusion to right eye and forehead. Neurological: Negative for headaches, focal weakness or numbness. ____________________________________________   PHYSICAL EXAM:  VITAL SIGNS: ED Triage Vitals  Enc Vitals Group     BP 11/25/19 2103 (!) 161/75     Pulse Rate 11/25/19 2103 89     Resp 11/25/19 2103 16     Temp 11/25/19 2103 98 F (36.7 C)     Temp Source 11/25/19 2103 Oral     SpO2 11/25/19 2103 96 %     Weight 11/25/19 2104 104 lb (47.2 kg)     Height 11/25/19 2104 5' (1.524 m)     Head Circumference --      Peak Flow --      Pain Score 11/25/19 2103 0     Pain Loc --      Pain Edu? --      Excl. in [place]? --     Constitutional: Alert and oriented. Well appearing and in no acute distress. Eyes: Conjunctivae are normal. PERRL. EOMI. Head: Atraumatic. Nose: No congestion/rhinnorhea. Mouth/Throat: Mucous membranes are moist.  Oropharynx non-erythematous. Neck: No stridor.   Hematological/Lymphatic/Immunilogical: No cervical lymphadenopathy. Cardiovascular: Normal  rate, regular rhythm. Grossly normal heart sounds.  Good peripheral circulation. Respiratory: Normal respiratory effort.  No retractions. Lungs CTAB. Gastrointestinal: Soft and nontender. No distention. No abdominal bruits. No CVA tenderness. Genitourinary:  Musculoskeletal: No lower extremity tenderness nor edema.  No joint effusions. Neurologic:  Normal speech and language. No gross focal neurologic deficits are appreciated. No gait instability. Skin:  No laceration to right forehead or eye. Ecchymosis is present.  Psychiatric: Mood and affect are  normal. Speech and behavior are normal.  ____________________________________________   LABS (all labs ordered are listed, but only abnormal results are displayed)  Labs Reviewed - No data to display ____________________________________________  EKG  Not indicated. ____________________________________________  RADIOLOGY  ED MD interpretation:    CT of the head and maxillofacial bones is negative for acute findings.  Official radiology report(s): No results found.  ____________________________________________   PROCEDURES  Procedure(s) performed (including Critical Care):  Procedures  ____________________________________________   INITIAL IMPRESSION / ASSESSMENT AND PLAN     84 year old female presenting to the emergency department with a mechanical, nonsyncopal fall.  Exam is reassuring.  While awaiting ER room assignment, CT of the maxillofacial bones and head was performed.  No acute findings per radiology.   ED COURSE  Patient denies headache or any symptoms of concern.  CTs are clear.  She will be discharged back to the nursing home facility.  She is to follow-up with her primary care provider or return to the emergency department for acute changes. ____________________________________________   FINAL CLINICAL IMPRESSION(S) / ED DIAGNOSES  Final diagnoses:  Minor head injury, initial encounter  Eye contusion, right, initial encounter     ED Discharge Orders    None       Note:  This document was prepared using Dragon voice recognition software and may include unintentional dictation errors.   Chinita Pester, FNP 11/27/19 2042    Phineas Semen, MD 11/28/19 [phone]

## 2020-02-25 ENCOUNTER — Emergency Department
Admission: EM | Admit: 2020-02-25 | Discharge: 2020-02-25 | Disposition: A | Discharge status: Home / Self Care | Payer: Medicare PPO | Attending: Emergency Medicine | Admitting: Emergency Medicine

## 2020-02-25 ENCOUNTER — Encounter: Payer: Self-pay | Admitting: Emergency Medicine

## 2020-02-25 ENCOUNTER — Emergency Department: Payer: Medicare PPO

## 2020-02-25 DIAGNOSIS — S0990XA Unspecified injury of head, initial encounter: Secondary | ICD-10-CM | POA: Diagnosis present

## 2020-02-25 DIAGNOSIS — F0390 Unspecified dementia without behavioral disturbance: Secondary | ICD-10-CM | POA: Diagnosis not present

## 2020-02-25 DIAGNOSIS — W010XXA Fall on same level from slipping, tripping and stumbling without subsequent striking against object, initial encounter: Secondary | ICD-10-CM | POA: Insufficient documentation

## 2020-02-25 DIAGNOSIS — Y929 Unspecified place or not applicable: Secondary | ICD-10-CM | POA: Diagnosis not present

## 2020-02-25 DIAGNOSIS — Z79899 Other long term (current) drug therapy: Secondary | ICD-10-CM | POA: Diagnosis not present

## 2020-02-25 DIAGNOSIS — Z7982 Long term (current) use of aspirin: Secondary | ICD-10-CM | POA: Diagnosis not present

## 2020-02-25 DIAGNOSIS — Y9301 Activity, walking, marching and hiking: Secondary | ICD-10-CM | POA: Insufficient documentation

## 2020-02-25 DIAGNOSIS — S022XXA Fracture of nasal bones, initial encounter for closed fracture: Secondary | ICD-10-CM | POA: Diagnosis not present

## 2020-02-25 DIAGNOSIS — S0083XA Contusion of other part of head, initial encounter: Secondary | ICD-10-CM

## 2020-02-25 DIAGNOSIS — Y999 Unspecified external cause status: Secondary | ICD-10-CM | POA: Diagnosis not present

## 2020-02-25 NOTE — ED Notes (Signed)
This RN called [facility] for transporting pt back from ED. Per [facility] we will need to schedule EMS transport.

## 2020-02-25 NOTE — ED Triage Notes (Addendum)
Pt presents to ED via [facility] from [facility] with c/o fall x 2 today. Pt states normally walks with walker however states "just fell" when she hurried to fast. Pt states "they don't come unless someone is dying". Pt with obvious facial injury upon arrival to ED.  The only pain patient c/o is pain to L arm, however unrelated to fall, pt had 2nd covid vaccine yesterday to L arm and patient c/o pain with movement of L arm.

## 2020-02-25 NOTE — ED Notes (Signed)
Spoke with Dr. Kinner regarding patient care, see orders.  

## 2020-02-25 NOTE — ED Notes (Signed)
Reports lives at [facility] was walking with her walker and tripped and fell, presents with purplish like marking to nose bridge and c/o of right arm pain. Awaiting md eval and plan of care.

## 2020-02-25 NOTE — ED Triage Notes (Signed)
Pt comes into the ED via EMS from [facility] with 2 falls today with facial injuries. Had 2nd covid vaccine yesterday.

## 2020-02-25 NOTE — ED Provider Notes (Signed)
[facility] Emergency Department Provider Note  ____________________________________________  Time seen: Approximately 7:56 PM  I have reviewed the triage vital signs and the nursing notes.   HISTORY  Chief Complaint Fall    HPI Angelica Collins is a 85 y.o. female with a history of dementia, hyperlipidemia, chronic ambulatory dysfunction using a walker at baseline who notes that  today at dinnertime she went to go check the schedule, states that she walked too fast with her walker and tripped and fell.  No loss of consciousness.  Denies any acute pain.  No neck pain vision changes or headache.  Feels normal except for some right arm soreness that started after her second Covid vaccine yesterday.  No paresthesias or focal weakness.     Past Medical History:  Diagnosis Date  . Cognitive impairment   . Dementia ([place])   . Geographic tongue   . Hypercholesteremia   . Hyperlipidemia   . Nonrheumatic mitral (valve) prolapse   . Raynaud's syndrome without gangrene      Patient Active Problem List   Diagnosis Date Noted  . Sepsis ([place]) 03/26/2019     History reviewed. No pertinent surgical history.   Prior to Admission medications   Medication Sig Start Date End Date Taking? Authorizing Provider  acetaminophen (TYLENOL) 325 MG tablet Take 650 mg by mouth every 4 (four) hours. 05/31/18   [provider]  aspirin 81 MG chewable tablet Chew 81 mg by mouth daily.    [provider]  atorvastatin (LIPITOR) 10 MG tablet Take 10 mg by mouth daily.    [provider]  calcium-vitamin D (OSCAL WITH D) 500-200 MG-UNIT tablet Take 2 tablets by mouth daily.    [provider]  Polyethylene Glycol 3350 POWD 17 g daily as needed (constipation).     [provider]  sertraline (ZOLOFT) 100 MG tablet Take 100 mg by mouth daily. 01/18/19   [provider]     Allergies Patient has no known allergies.   No family  history on file.  Social History Social History   Tobacco Use  . Smoking status: Never Smoker  . Smokeless tobacco: Never Used  Substance Use Topics  . Alcohol use: No  . Drug use: No    Review of Systems  Constitutional:   No fever or chills.  ENT:   No sore throat. No rhinorrhea. Cardiovascular:   No chest pain or syncope. Respiratory:   No dyspnea or cough. Gastrointestinal:   Negative for abdominal pain, vomiting and diarrhea.  Musculoskeletal: Right arm pain as above All other systems reviewed and are negative except as documented above in ROS and HPI.  ____________________________________________   PHYSICAL EXAM:  VITAL SIGNS: ED Triage Vitals [02/25/20 1733]  Enc Vitals Group     BP (!) 190/94     Pulse Rate 95     Resp 20     Temp 99 F (37.2 C)     Temp Source Oral     SpO2 100 %     Weight      Height      Head Circumference      Peak Flow      Pain Score 10     Pain Loc      Pain Edu?      Excl. in [place]?     Vital signs reviewed, nursing assessments reviewed.   Constitutional:   Alert and oriented. Non-toxic appearance. Eyes:   Conjunctivae are normal. EOMI. PERRL.  ENT      Head:   Normocephalic with small amount of ecchymosis over the bridge of the nose.  No laceration.  No raccoon eyes or battle sign.  No rhinorrhea or otorrhea..      Nose: No epistaxis      Mouth/Throat:   No intraoral injury      Neck:   No meningismus. Full ROM.  No midline spinal tenderness Hematological/Lymphatic/Immunilogical:   No cervical lymphadenopathy. Cardiovascular:   RRR. Symmetric bilateral radial and DP pulses.  No murmurs. Cap refill less than 2 seconds. Respiratory:   Normal respiratory effort without tachypnea/retractions. Breath sounds are clear and equal bilaterally. No wheezes/rales/rhonchi. Gastrointestinal:   Soft and nontender. Non distended. There is no CVA tenderness.  No rebound, rigidity, or guarding.  Musculoskeletal:   Normal range of motion in  all extremities. No joint effusions.  No lower extremity tenderness.  No edema.  Joints stable, long bones stable. Neurologic:   Normal speech and language.  Motor grossly intact. No acute focal neurologic deficits are appreciated.  Skin:    Skin is warm, dry and intact. No rash noted.  No petechiae, purpura, or bullae.  ____________________________________________    LABS (pertinent positives/negatives) (all labs ordered are listed, but only abnormal results are displayed) Labs Reviewed - No data to display ____________________________________________   EKG    ____________________________________________    RADIOLOGY  CT Head Wo Contrast  Result Date: 02/25/2020 CLINICAL DATA:  Larey Seat twice today, facial injuries EXAM: CT HEAD WITHOUT CONTRAST TECHNIQUE: Contiguous axial images were obtained from the base of the skull through the vertex without intravenous contrast. COMPARISON:  11/25/2019 FINDINGS: Brain: Scattered hypodensities throughout the periventricular and subcortical white matter are unchanged, consistent with chronic small vessel ischemic changes. No signs of acute infarct or hemorrhage. The lateral ventricles and midline structures are stable. No acute extra-axial fluid collections. No mass effect. Vascular: No hyperdense vessel or unexpected calcification. Skull: Normal. Negative for fracture or focal lesion. Sinuses/Orbits: No acute finding. Other: None IMPRESSION: 1. Chronic small vessel ischemic changes. No acute intracranial process. Electronically Signed   By: Sharlet Salina M.D.   On: 02/25/2020 18:15   CT Cervical Spine Wo Contrast  Result Date: 02/25/2020 CLINICAL DATA:  Multiple falls today, facial injuries EXAM: CT CERVICAL SPINE WITHOUT CONTRAST TECHNIQUE: Multidetector CT imaging of the cervical spine was performed without intravenous contrast. Multiplanar CT image reconstructions were also generated. COMPARISON:  None. FINDINGS: Alignment: Alignment is grossly  anatomic. Skull base and vertebrae: No acute displaced cervical spine fracture. 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 Soft tissues and spinal canal: No prevertebral fluid or swelling. No visible canal hematoma. Disc levels: There is mild multilevel spondylosis greatest from C4 through C7. Mild bony encroachment upon the neural foramina bilaterally and symmetrically at C5-6 and C6-7. Upper chest: Airway is patent.  Lung apices are clear. Other: Reconstructed images demonstrate no additional findings. IMPRESSION: 1. Mild cervical spondylosis.  No acute fracture. Electronically Signed   By: Sharlet Salina M.D.   On: 02/25/2020 18:18   CT Maxillofacial Wo Contrast  Result Date: 02/25/2020 CLINICAL DATA:  Larey Seat twice today, facial injuries EXAM: CT MAXILLOFACIAL WITHOUT CONTRAST TECHNIQUE: Multidetector CT imaging of the maxillofacial structures was performed. Multiplanar CT image reconstructions were also generated. COMPARISON:  11/25/2019 FINDINGS: Osseous: Subtle cortical discontinuity anterior aspect of the nasal bone consistent with fracture. No significant displacement. No other facial bone fractures. Orbits: Negative. No traumatic or inflammatory finding. Sinuses: Clear. Soft tissues:  Soft tissue swelling over the nasal bridge. Remaining soft tissues are unremarkable. Limited intracranial: No significant or unexpected finding. IMPRESSION: 1. Nondisplaced nasal bone fracture with overlying soft tissue swelling. Electronically Signed   By: Sharlet Salina M.D.   On: 02/25/2020 18:20    ____________________________________________   PROCEDURES Procedures  ____________________________________________  DIFFERENTIAL DIAGNOSIS   Intracranial hemorrhage, C-spine fracture, facial bone fracture  CLINICAL IMPRESSION / ASSESSMENT AND PLAN / ED COURSE  Medications ordered in the ED: Medications - No data to display  Pertinent labs & imaging results that were available during my care of  the patient were reviewed by me and considered in my medical decision making (see chart for details).  Danila Eddie was evaluated in Emergency Department on 02/25/2020 for the symptoms described in the history of present illness. She was evaluated in the context of the global COVID-19 pandemic, which necessitated consideration that the patient might be at risk for infection with the SARS-CoV-2 virus that causes COVID-19. Institutional protocols and algorithms that pertain to the evaluation of patients at risk for COVID-19 are in a state of rapid change based on information released by regulatory bodies including the CDC and federal and state organizations. These policies and algorithms were followed during the patient's care in the ED.   Patient presents after a mechanical fall, has some evidence of head trauma.  CT scans are negative for intracranial hemorrhage or C-spine fracture.  She does have an isolated nondisplaced nasal bone fracture.  No evidence of basilar skull fracture.  She is stable for discharge home to follow-up with ENT.  Discussed nasal precautions.      ____________________________________________   FINAL CLINICAL IMPRESSION(S) / ED DIAGNOSES    Final diagnoses:  Closed fracture of nasal bone, initial encounter  Contusion of face, initial encounter     ED Discharge Orders    None      Portions of this note were generated with dragon dictation software. Dictation errors may occur despite best attempts at proofreading.   Sharman Cheek, MD 02/25/20 1958

## 2020-02-25 NOTE — Discharge Instructions (Signed)
Your CT scan shows a nondisplaced fracture of the nasal bone, without any other serious injuries.  You should follow up with ENT in 1 week for further evaluation.

## 2020-05-04 ENCOUNTER — Other Ambulatory Visit: Payer: Self-pay

## 2020-05-04 ENCOUNTER — Encounter: Payer: Self-pay | Admitting: Emergency Medicine

## 2020-05-04 DIAGNOSIS — Y9301 Activity, walking, marching and hiking: Secondary | ICD-10-CM | POA: Insufficient documentation

## 2020-05-04 DIAGNOSIS — Z043 Encounter for examination and observation following other accident: Secondary | ICD-10-CM | POA: Insufficient documentation

## 2020-05-04 DIAGNOSIS — W010XXA Fall on same level from slipping, tripping and stumbling without subsequent striking against object, initial encounter: Secondary | ICD-10-CM | POA: Insufficient documentation

## 2020-05-04 DIAGNOSIS — Y999 Unspecified external cause status: Secondary | ICD-10-CM | POA: Insufficient documentation

## 2020-05-04 DIAGNOSIS — Y92129 Unspecified place in nursing home as the place of occurrence of the external cause: Secondary | ICD-10-CM | POA: Diagnosis not present

## 2020-05-04 DIAGNOSIS — Z7982 Long term (current) use of aspirin: Secondary | ICD-10-CM | POA: Insufficient documentation

## 2020-05-04 DIAGNOSIS — F0390 Unspecified dementia without behavioral disturbance: Secondary | ICD-10-CM | POA: Diagnosis not present

## 2020-05-04 LAB — BASIC METABOLIC PANEL
Anion gap: 9 (ref 5–15)
BUN: 19 mg/dL (ref 8–23)
CO2: 25 mmol/L (ref 22–32)
Calcium: 8.8 mg/dL — ABNORMAL LOW (ref 8.9–10.3)
Chloride: 106 mmol/L (ref 98–111)
Creatinine, Ser: 0.96 mg/dL (ref 0.44–1.00)
GFR calc Af Amer: >60 mL/min (ref >60)
GFR calc non Af Amer: 55 mL/min — ABNORMAL LOW (ref >60)
Glucose, Bld: 90 mg/dL (ref 70–99)
Potassium: 3.9 mmol/L (ref 3.5–5.1)
Sodium: 140 mmol/L (ref 135–145)

## 2020-05-04 LAB — TROPONIN I (HIGH SENSITIVITY): Troponin I (High Sensitivity): 11 ng/L (ref <18)

## 2020-05-04 LAB — CBC
HCT: 42 % (ref 36.0–46.0)
Hemoglobin: 13.7 g/dL (ref 12.0–15.0)
MCH: 28.6 pg (ref 26.0–34.0)
MCHC: 32.6 g/dL (ref 30.0–36.0)
MCV: 87.7 fL (ref 80.0–100.0)
Platelets: 215 10*3/uL (ref 150–400)
RBC: 4.79 MIL/uL (ref 3.87–5.11)
RDW: 14.6 % (ref 11.5–15.5)
WBC: 8.8 10*3/uL (ref 4.0–10.5)
nRBC: 0 % (ref 0.0–0.2)

## 2020-05-04 MED ORDER — SODIUM CHLORIDE 0.9% FLUSH: 3.0000 mL | Freq: Once | INTRAVENOUS | Status: DC

## 2020-05-04 NOTE — ED Triage Notes (Signed)
Pt to ED from [facility] via EMS c/o fall today, states lost her balance, denies hitting head, denies pain or LOC.  Pt alert to self, situation, and place, disoriented to year.  States help at [facility] denied her and attempted to do ADLs her own.

## 2020-05-05 ENCOUNTER — Emergency Department
Admission: EM | Admit: 2020-05-05 | Discharge: 2020-05-05 | Disposition: A | Discharge status: Home / Self Care | Payer: Medicare PPO | Attending: Emergency Medicine | Admitting: Emergency Medicine

## 2020-05-05 DIAGNOSIS — I10 Essential (primary) hypertension: Secondary | ICD-10-CM

## 2020-05-05 DIAGNOSIS — W19XXXA Unspecified fall, initial encounter: Secondary | ICD-10-CM

## 2020-05-05 NOTE — ED Provider Notes (Signed)
[facility] Emergency Department Provider Note  ____________________________________________   First MD Initiated Contact with Patient 05/05/20 [phone]     (approximate)  I have reviewed the triage vital signs and the nursing notes.   HISTORY  Chief Complaint Fall  Level 5 caveat:  history/ROS limited by chronic dementia and/or cognitive impairment.  HPI Angelica Collins is a 85 y.o. female whose medical history as listed below and includes mild cognitive impairment and/or dementia.  She presents by EMS from [facility] for evaluation after a fall.  They report both from the paramedics and from the patient is that she lost her balance and fell onto her buttocks.  She reports no pain.  She says that she is supposed to have assistance doing things but she likes to be independent.  When the staff talk to her after finding out that she fell they insisted she come to the emergency department although she tried to decline.  She has been resting for more than 8 hours due to overwhelming ED and hospital volume.  She states that she did not hit her head and did not lose consciousness.  She has no headache, neck pain, chest pain, shortness of breath, nausea, vomiting, and abdominal pain.  She has chronic bladder incontinence and wears adult undergarments but she has had no dysuria no foul-smelling urine.  Nothing in particular made her symptoms better or worse in the fall was acute in onset.         Past Medical History:  Diagnosis Date  . Cognitive impairment   . Dementia ([place])   . Geographic tongue   . Hypercholesteremia   . Hyperlipidemia   . Nonrheumatic mitral (valve) prolapse   . Raynaud's syndrome without gangrene     Patient Active Problem List   Diagnosis Date Noted  . Sepsis ([place]) 03/26/2019    History reviewed. No pertinent surgical history.  Prior to Admission medications   Medication Sig Start Date End Date Taking? Authorizing Provider  acetaminophen  (TYLENOL) 325 MG tablet Take 650 mg by mouth every 4 (four) hours. 05/31/18   [provider]  aspirin 81 MG chewable tablet Chew 81 mg by mouth daily.    [provider]  atorvastatin (LIPITOR) 10 MG tablet Take 10 mg by mouth daily.    [provider]  calcium-vitamin D (OSCAL WITH D) 500-200 MG-UNIT tablet Take 2 tablets by mouth daily.    [provider]  Polyethylene Glycol 3350 POWD 17 g daily as needed (constipation).     [provider]  sertraline (ZOLOFT) 100 MG tablet Take 100 mg by mouth daily. 01/18/19   [provider]    Allergies Patient has no known allergies.  History reviewed. No pertinent family history.  Social History Social History   Tobacco Use  . Smoking status: Never Smoker  . Smokeless tobacco: Never Used  Substance Use Topics  . Alcohol use: No  . Drug use: No    Review of Systems Level 5 caveat:  history/ROS limited by chronic dementia and/or cognitive impairment.  Constitutional: No fever/chills Eyes: No visual changes. ENT: No sore throat. Cardiovascular: Denies chest pain. Respiratory: Denies shortness of breath. Gastrointestinal: No abdominal pain.  No nausea, no vomiting.  No diarrhea.  No constipation. Genitourinary: Negative for dysuria. Musculoskeletal: Negative for neck pain.  Negative for back pain. Integumentary: Negative for rash. Neurological: Negative for headaches, focal weakness or numbness.   ____________________________________________   PHYSICAL EXAM:  VITAL SIGNS: ED Triage  Vitals [05/04/20 2151]  Enc Vitals Group     BP (!) 202/87     Pulse Rate 88     Resp 16     Temp 98.6 F (37 C)     Temp Source Oral     SpO2 96 %     Weight 54.4 kg (120 lb)     Height 1.626 m (5\' 4" )     Head Circumference      Peak Flow      Pain Score 0     Pain Loc      Pain Edu?      Excl. in [place]?     Constitutional: Alert and oriented to self and the situation and being in the  emergency department.  Unclear about the date.  No acute distress, pleasant and interactive and conversant. Eyes: Conjunctivae are normal.  Head: Atraumatic. Nose: No congestion/rhinnorhea. Mouth/Throat: Patient is wearing a mask. Neck: No stridor.  No meningeal signs.   Cardiovascular: Normal rate, regular rhythm. Good peripheral circulation. Grossly normal heart sounds. Respiratory: Normal respiratory effort.  No retractions. Gastrointestinal: Soft and nontender. No distention.  Musculoskeletal: No tenderness to palpation of the neck, no pain with flexion, extension, nor rotation of the head and neck.  No lower extremity tenderness nor edema. No gross deformities of extremities. Neurologic:  Normal speech and language. No gross focal neurologic deficits are appreciated.  Skin:  Skin is warm, dry and intact. Psychiatric: Mood and affect are normal. Speech and behavior are normal.  ____________________________________________   LABS (all labs ordered are listed, but only abnormal results are displayed)  Labs Reviewed  BASIC METABOLIC PANEL - Abnormal; Notable for the following components:      Result Value   Calcium 8.8 (*)    GFR calc non Af Amer 55 (*)    All other components within normal limits  CBC  TROPONIN I (HIGH SENSITIVITY)   ____________________________________________  EKG  ED ECG REPORT I, , the attending physician, personally viewed and interpreted this ECG.  Date: 05/04/2020 EKG Time: 21:50 Rate: 87 Rhythm: normal sinus rhythm QRS Axis: normal Intervals: normal ST/T Wave abnormalities: normal Narrative Interpretation: no evidence of acute ischemia  ____________________________________________  RADIOLOGY I, 07/04/2020, personally viewed and evaluated these images (plain radiographs) as part of my medical decision making, as well as reviewing the written report by the radiologist.  ED MD interpretation: No indication for emergent  imaging  Official radiology report(s): No results found.  ____________________________________________   PROCEDURES   Procedure(s) performed (including Critical Care):  Procedures   ____________________________________________   INITIAL IMPRESSION / MDM / ASSESSMENT AND PLAN / ED COURSE  As part of my medical decision making, I reviewed the following data within the electronic MEDICAL RECORD NUMBER Nursing notes reviewed and incorporated, Labs reviewed , EKG interpreted , Old chart reviewed and Notes from prior ED visits   Differential diagnosis includes, but is not limited to, mechanical fall, fracture or dislocation, acute arrhythmia, metabolic or electrolyte abnormality, acute infection.  The patient has been asymptomatic and resting quietly for the last 8 hours since coming to the ED prior to being placed in a bed.  Her basic metabolic panel, CBC, and high-sensitivity troponin are all generally reassuring and she has had no chest pain or shortness of breath.  She has stable vital signs other than hypertension.  I see no prior documented hypertension as a prior diagnosis, but looking back through her historical data she always has an elevated  blood pressure.  Repeat blood pressure after being in the department was still elevated but not substantially so.  This is asymptomatic hypertension and not appropriate for treatment in the ED.  Physical examination reveals no evidence of acute injury no indication for emergent imaging.  The patient is comfortable with the plan to go back to [facility] and I am discharging her for close outpatient follow-up.          ____________________________________________  FINAL CLINICAL IMPRESSION(S) / ED DIAGNOSES  Final diagnoses:  Fall, initial encounter     MEDICATIONS GIVEN DURING THIS VISIT:  Medications  sodium chloride flush (NS) 0.9 % injection 3 mL (has no administration in time range)     ED Discharge Orders    None       *Please note:  Neveen Daponte was evaluated in Emergency Department on 05/05/2020 for the symptoms described in the history of present illness. She was evaluated in the context of the global COVID-19 pandemic, which necessitated consideration that the patient might be at risk for infection with the SARS-CoV-2 virus that causes COVID-19. Institutional protocols and algorithms that pertain to the evaluation of patients at risk for COVID-19 are in a state of rapid change based on information released by regulatory bodies including the CDC and federal and state organizations. These policies and algorithms were followed during the patient's care in the ED.  Some ED evaluations and interventions may be delayed as a result of limited staffing during the pandemic.*  Note:  This document was prepared using Dragon voice recognition software and may include unintentional dictation errors.   Loleta Rose, MD 05/05/20 [phone]

## 2020-05-05 NOTE — ED Notes (Signed)
Patient brief and pajamas wet. Patient changed into dry brief and paper scrubs. Clothing bagged and sent with EMS.

## 2020-05-05 NOTE — Discharge Instructions (Addendum)
You have been seen in the Emergency Department (ED) today for a fall.  Your work up does not show any concerning injuries.  Please take over-the-counter ibuprofen and/or Tylenol as needed for your pain (unless you have an allergy or your doctor as told you not to take them), or take any prescribed medication as instructed.  Additionally, your blood pressure was elevated tonight.  You have no signs or symptoms of an emergency associated with your blood pressure, but you should follow-up with your regular primary care provider for repeated blood pressure measurements to see if you would benefit from blood pressure medication.  Please follow up with your doctor regarding today's Emergency Department (ED) visit and your recent fall.    Return to the ED if you have any headache, confusion, slurred speech, weakness/numbness of any arm or leg, or any increased pain.

## 2020-05-05 NOTE — ED Notes (Signed)
Pt ambulated in room with walker with slow steady gait.  Patient states feels like normal, denies pain or weakness.  MD notified.  Attempting to find patient a ride back to [facility] for discharge at this time.

## 2020-12-03 ENCOUNTER — Other Ambulatory Visit: Payer: Self-pay

## 2020-12-03 DIAGNOSIS — Z7982 Long term (current) use of aspirin: Secondary | ICD-10-CM | POA: Diagnosis not present

## 2020-12-03 DIAGNOSIS — N390 Urinary tract infection, site not specified: Secondary | ICD-10-CM | POA: Insufficient documentation

## 2020-12-03 DIAGNOSIS — S06360A Traumatic hemorrhage of cerebrum, unspecified, without loss of consciousness, initial encounter: Secondary | ICD-10-CM | POA: Insufficient documentation

## 2020-12-03 DIAGNOSIS — W010XXA Fall on same level from slipping, tripping and stumbling without subsequent striking against object, initial encounter: Secondary | ICD-10-CM | POA: Insufficient documentation

## 2020-12-03 DIAGNOSIS — F0390 Unspecified dementia without behavioral disturbance: Secondary | ICD-10-CM | POA: Insufficient documentation

## 2020-12-03 DIAGNOSIS — Z79899 Other long term (current) drug therapy: Secondary | ICD-10-CM | POA: Diagnosis not present

## 2020-12-03 DIAGNOSIS — Z20822 Contact with and (suspected) exposure to covid-19: Secondary | ICD-10-CM | POA: Insufficient documentation

## 2020-12-03 DIAGNOSIS — S0990XA Unspecified injury of head, initial encounter: Secondary | ICD-10-CM | POA: Diagnosis present

## 2020-12-03 LAB — BASIC METABOLIC PANEL
Anion gap: 11 (ref 5–15)
BUN: 22 mg/dL (ref 8–23)
CO2: 26 mmol/L (ref 22–32)
Calcium: 9.3 mg/dL (ref 8.9–10.3)
Chloride: 102 mmol/L (ref 98–111)
Creatinine, Ser: 0.99 mg/dL (ref 0.44–1.00)
GFR, Estimated: 57 mL/min — ABNORMAL LOW (ref >60)
Glucose, Bld: 134 mg/dL — ABNORMAL HIGH (ref 70–99)
Potassium: 4.6 mmol/L (ref 3.5–5.1)
Sodium: 139 mmol/L (ref 135–145)

## 2020-12-03 LAB — CBC
HCT: 46 % (ref 36.0–46.0)
Hemoglobin: 14.5 g/dL (ref 12.0–15.0)
MCH: 28.2 pg (ref 26.0–34.0)
MCHC: 31.5 g/dL (ref 30.0–36.0)
MCV: 89.3 fL (ref 80.0–100.0)
Platelets: 205 10*3/uL (ref 150–400)
RBC: 5.15 MIL/uL — ABNORMAL HIGH (ref 3.87–5.11)
RDW: 14.5 % (ref 11.5–15.5)
WBC: 11.5 10*3/uL — ABNORMAL HIGH (ref 4.0–10.5)
nRBC: 0 % (ref 0.0–0.2)

## 2020-12-03 LAB — TROPONIN I (HIGH SENSITIVITY): Troponin I (High Sensitivity): 8 ng/L (ref <18)

## 2020-12-03 NOTE — ED Triage Notes (Signed)
Pt presents via EMS from [facility]. Difficult to obtain accurate H&P due to cognitive disability.  Pt reports fall today on the way to lunch.

## 2020-12-03 NOTE — ED Triage Notes (Signed)
FIRST NURSE NOTE:   Pt arrived via [facility] from [facility], not acting normal, leaning to R, pt has hx of cognitive impairment. Hx of UTIs, denies any pain.  Has DNR paperwork  207/79 taking meds 99.1 temp  CBG -127

## 2020-12-04 ENCOUNTER — Emergency Department: Payer: Medicare PPO

## 2020-12-04 ENCOUNTER — Emergency Department
Admission: EM | Admit: 2020-12-04 | Discharge: 2020-12-04 | Disposition: A | Discharge status: Home / Self Care | Payer: Medicare PPO | Attending: Emergency Medicine | Admitting: Emergency Medicine

## 2020-12-04 DIAGNOSIS — N390 Urinary tract infection, site not specified: Secondary | ICD-10-CM

## 2020-12-04 DIAGNOSIS — S06350A Traumatic hemorrhage of left cerebrum without loss of consciousness, initial encounter: Secondary | ICD-10-CM

## 2020-12-04 DIAGNOSIS — W19XXXA Unspecified fall, initial encounter: Secondary | ICD-10-CM

## 2020-12-04 DIAGNOSIS — S06320A Contusion and laceration of left cerebrum without loss of consciousness, initial encounter: Secondary | ICD-10-CM

## 2020-12-04 LAB — URINALYSIS, COMPLETE (UACMP) WITH MICROSCOPIC
Bilirubin Urine: NEGATIVE
Glucose, UA: NEGATIVE mg/dL
Hgb urine dipstick: NEGATIVE
Ketones, ur: NEGATIVE mg/dL
Nitrite: POSITIVE — AB
Protein, ur: 30 mg/dL — AB
Specific Gravity, Urine: 1.023 (ref 1.005–1.030)
WBC, UA: >50 WBC/hpf — ABNORMAL HIGH (ref 0–5)
pH: 5 (ref 5.0–8.0)

## 2020-12-04 LAB — RESP PANEL BY RT-PCR (FLU A&B, COVID) ARPGX2
Influenza A by PCR: NEGATIVE
Influenza B by PCR: NEGATIVE
SARS Coronavirus 2 by RT PCR: NEGATIVE

## 2020-12-04 LAB — TROPONIN I (HIGH SENSITIVITY): Troponin I (High Sensitivity): 7 ng/L (ref <18)

## 2020-12-04 LAB — LACTIC ACID, PLASMA: Lactic Acid, Venous: 1 mmol/L (ref 0.5–1.9)

## 2020-12-04 MED ORDER — CEPHALEXIN 250 MG PO CAPS: 250.0000 mg | ORAL_CAPSULE | Freq: Three times a day (TID) | ORAL | 0 refills | Status: DC

## 2020-12-04 MED ORDER — SODIUM CHLORIDE 0.9 % IV BOLUS
500.0000 mL | Freq: Once | INTRAVENOUS | Status: AC
  Administered 2020-12-04: 500 mL via INTRAVENOUS

## 2020-12-04 MED ORDER — SODIUM CHLORIDE 0.9 % IV SOLN
1.0000 g | Freq: Once | INTRAVENOUS | Status: AC
  Administered 2020-12-04: 1 g via INTRAVENOUS
  Filled 2020-12-04: qty 10

## 2020-12-04 NOTE — ED Notes (Signed)
Attempted to contact [facility] staff at [phone]. Unable to get staff via phone at this time.

## 2020-12-04 NOTE — ED Notes (Addendum)
Bed in low and locked position, siderails up, non skid yellow socks in place. Pt resting. Fall posey pad in place and turned on.

## 2020-12-04 NOTE — ED Notes (Signed)
Pt given warm blanket.

## 2020-12-04 NOTE — Discharge Instructions (Signed)
1.  Hold Aspirin x7 days. 2.  Take antibiotic as prescribed (Keflex 250 mg 3 times daily x7 days). 3.  Urine culture is pending. We will let your facility know if you need a change in antibiotics. 4.  Return to the ER for worsening symptoms, persistent vomiting, difficulty breathing or other concerns.

## 2020-12-04 NOTE — ED Notes (Signed)
Report to lorrie, rn.  

## 2020-12-04 NOTE — ED Notes (Signed)
Pt has pulled out iv. Will initiate another. Pt informed to please leave iv in place.

## 2020-12-04 NOTE — ED Notes (Signed)
Pt states she is not sure why she is in the emergency department. Pt states she fell yesterday in the dining room where she lives but did not injure herself. Pt states she normally ambulates with a walker and has PT that helps her.

## 2020-12-04 NOTE — ED Provider Notes (Signed)
[facility] Emergency Department Provider Note   ____________________________________________   Event Date/Time   First MD Initiated Contact with Patient 12/04/20 0200     (approximate)  I have reviewed the triage vital signs and the nursing notes.   HISTORY  Chief Complaint Weakness  Level V caveat: Limited by dementia  HPI Angelica Collins is a 86 y.o. female brought to the ED via EMS from [facility] with a chief complaint of fall, "not acting right".  Patient with a history of cognitive impairment, dementia, hyperlipidemia, Reynaud's syndrome who states she loses her balance often and fell today on the way to lunch.  States she did not strike her head and did not injure herself.  Really does not know why she is in the emergency department.  Voices no medical complaints.  Specifically, denies cough, chest pain, shortness of breath, abdominal pain, nausea, vomiting or diarrhea.     Past Medical History:  Diagnosis Date  . Cognitive impairment   . Dementia (HCC)   . Geographic tongue   . Hypercholesteremia   . Hyperlipidemia   . Nonrheumatic mitral (valve) prolapse   . Raynaud's syndrome without gangrene     Patient Active Problem List   Diagnosis Date Noted  . Sepsis (HCC) 03/26/2019    No past surgical history on file.  Prior to Admission medications   Medication Sig Start Date End Date Taking? Authorizing Provider  cephALEXin (KEFLEX) 250 MG capsule Take 1 capsule (250 mg total) by mouth 3 (three) times daily. 12/04/20  Yes Irean Hong, MD  acetaminophen (TYLENOL) 325 MG tablet Take 650 mg by mouth every 4 (four) hours. 05/31/18   [provider]  aspirin 81 MG chewable tablet Chew 81 mg by mouth daily.    [provider]  atorvastatin (LIPITOR) 10 MG tablet Take 10 mg by mouth daily.    [provider]  calcium-vitamin D (OSCAL WITH D) 500-200 MG-UNIT tablet Take 2 tablets by mouth daily.    [provider]   Polyethylene Glycol 3350 POWD 17 g daily as needed (constipation).     [provider]  sertraline (ZOLOFT) 100 MG tablet Take 100 mg by mouth daily. 01/18/19   [provider]    Allergies Patient has no known allergies.  No family history on file.  Social History Social History   Tobacco Use  . Smoking status: Never Smoker  . Smokeless tobacco: Never Used  Vaping Use  . Vaping Use: Never used  Substance Use Topics  . Alcohol use: No  . Drug use: No    Review of Systems  Constitutional: Positive for fall.  No fever/chills Eyes: No visual changes. ENT: No sore throat. Cardiovascular: Denies chest pain. Respiratory: Denies shortness of breath. Gastrointestinal: No abdominal pain.  No nausea, no vomiting.  No diarrhea.  No constipation. Genitourinary: Negative for dysuria. Musculoskeletal: Negative for back pain. Skin: Negative for rash. Neurological: Negative for headaches, focal weakness or numbness.   ____________________________________________   PHYSICAL EXAM:  VITAL SIGNS: ED Triage Vitals [12/03/20 1842]  Enc Vitals Group     BP (!) 171/77     Pulse Rate 99     Resp 14     Temp 99.4 F (37.4 C)     Temp Source Oral     SpO2 94 %     Weight      Height      Head Circumference      Peak Flow  Pain Score 0     Pain Loc      Pain Edu?      Excl. in GC?     Constitutional: Alert and oriented.  Elderly appearing and in no acute distress. Eyes: Conjunctivae are normal. PERRL. EOMI. Head: Atraumatic. Nose: No congestion/rhinnorhea. Mouth/Throat: Mucous membranes are mildly dry.   Neck: No stridor.  No cervical spine tenderness to palpation.  No step-offs or deformities noted. Cardiovascular: Normal rate, regular rhythm. Grossly normal heart sounds.  Good peripheral circulation. Respiratory: Normal respiratory effort.  No retractions. Lungs CTAB. Gastrointestinal: Soft and nontender to light or deep palpation. No distention. No  abdominal bruits. No CVA tenderness. Musculoskeletal: No spinal tenderness to palpation.  Pelvis is stable.  No lower extremity tenderness nor edema.  No joint effusions. Neurologic: Alert and oriented to person and place.  Normal speech and language. No gross focal neurologic deficits are appreciated. MAEx4. Skin:  Skin is warm, dry and intact. No rash noted.  No petechiae. Psychiatric: Mood and affect are normal. Speech and behavior are normal.  ____________________________________________   LABS (all labs ordered are listed, but only abnormal results are displayed)  Labs Reviewed  BASIC METABOLIC PANEL - Abnormal; Notable for the following components:      Result Value   Glucose, Bld 134 (*)    GFR, Estimated 57 (*)    All other components within normal limits  CBC - Abnormal; Notable for the following components:   WBC 11.5 (*)    RBC 5.15 (*)    All other components within normal limits  URINALYSIS, COMPLETE (UACMP) WITH MICROSCOPIC - Abnormal; Notable for the following components:   Color, Urine YELLOW (*)    APPearance CLOUDY (*)    Protein, ur 30 (*)    Nitrite POSITIVE (*)    Leukocytes,Ua LARGE (*)    WBC, UA >50 (*)    Bacteria, UA MANY (*)    All other components within normal limits  RESP PANEL BY RT-PCR (FLU A&B, COVID) ARPGX2  CULTURE, BLOOD (ROUTINE X 2)  CULTURE, BLOOD (ROUTINE X 2)  URINE CULTURE  LACTIC ACID, PLASMA  TROPONIN I (HIGH SENSITIVITY)  TROPONIN I (HIGH SENSITIVITY)   ____________________________________________  EKG  ED ECG REPORT I, Angelica Wix J, the attending physician, personally viewed and interpreted this ECG.   Date: 12/04/2020  EKG Time: 1845  Rate: 94  Rhythm: normal EKG, normal sinus rhythm  Axis: Normal  Intervals:none  ST&T Change: Nonspecific  ____________________________________________  RADIOLOGY I, Ilya Neely J, personally viewed and evaluated these images (plain radiographs) as part of my medical decision making, as  well as reviewing the written report by the radiologist.  ED MD interpretation: No acute cardiopulmonary process; CT head discussed with radiologist Dr. Chase Picket which demonstrates small  67mm intraparenchymal hemorrhage in the left thalamus  Official radiology report(s): DG Chest 2 View  Result Date: 12/04/2020 CLINICAL DATA:  Cough EXAM: CHEST - 2 VIEW COMPARISON:  March 26, 2019 FINDINGS: The heart size and mediastinal contours are within normal limits. Mildly increased interstitial markings seen at both bases. No new airspace consolidation or pleural effusion. Aortic knob calcifications are seen. The visualized skeletal structures are unremarkable. IMPRESSION: No active cardiopulmonary disease. Electronically Signed   By: Jonna Clark M.D.   On: 12/04/2020 02:34   CT Head Wo Contrast  Result Date: 12/04/2020 CLINICAL DATA:  Encephalopathy EXAM: CT HEAD WITHOUT CONTRAST TECHNIQUE: Contiguous axial images were obtained from the base of the skull through the vertex without intravenous  contrast. COMPARISON:  02/25/2020 FINDINGS: Brain: Small focus of intraparenchymal hemorrhage in the left thalamus measuring 5 mm. No other acute abnormality. There is periventricular hypoattenuation compatible with chronic microvascular disease. Mild generalized volume loss. Vascular: No abnormal hyperdensity of the major intracranial arteries or dural venous sinuses. No intracranial atherosclerosis. Skull: The visualized skull base, calvarium and extracranial soft tissues are normal. Sinuses/Orbits: No fluid levels or advanced mucosal thickening of the visualized paranasal sinuses. No mastoid or middle ear effusion. The orbits are normal. IMPRESSION: 1. Small focus of intraparenchymal hemorrhage in the left thalamus measuring 5 mm. 2. Chronic microvascular ischemia and generalized volume loss. Critical Value/emergent results were called by telephone at the time of interpretation on 12/04/2020 at 3:23 am to provider Clarice Zulauf ,  who verbally acknowledged these results. Electronically Signed   By: Deatra Robinson M.D.   On: 12/04/2020 03:23    ____________________________________________   PROCEDURES  Procedure(s) performed (including Critical Care):  Procedures   ____________________________________________   INITIAL IMPRESSION / ASSESSMENT AND PLAN / ED COURSE  As part of my medical decision making, I reviewed the following data within the electronic MEDICAL RECORD NUMBER Nursing notes reviewed and incorporated, Labs reviewed, EKG interpreted, Old chart reviewed, Radiograph reviewed and Notes from prior ED visits     87 year old demented female sent to the ED for fall and altered mentation. Differential diagnosis includes, but is not limited to, alcohol, illicit or prescription medications, or other toxic ingestion; intracranial pathology such as stroke or intracerebral hemorrhage; fever or infectious causes including sepsis; hypoxemia and/or hypercarbia; uremia; trauma; endocrine related disorders such as diabetes, hypoglycemia, and thyroid-related diseases; hypertensive encephalopathy; etc.  Laboratory results thus far unremarkable.  Patient noted to have low-grade temperature and loose cough.  Will check blood cultures, lactic acid, respiratory panel and UA.  Obtain chest x-ray, pelvis x-ray, CT head.  Initiate IV fluid hydration and reassess   Clinical Course as of 12/04/20 0704  Sat Dec 04, 2020  5573 Discussed with radiologist Dr. Chase Picket regarding patient CT head that demonstrates small intraparenchymal bleed. Patient's MAR indicates she is only on a baby aspirin daily. I have discussed the case with neurosurgery on-call Dr. Madaline Brilliant who agrees with repeat CT head in 6 hours. If stable, patient may be able to be discharged at that time. [JS]  0421 Lactic acid negative. Repeat troponin stable. Awaiting results of UA and respiratory panel. Patient resting comfortably in no acute distress [JS]  0458 IV Rocephin  ordered for UTI.  Respiratory panel and chest x-ray negative. [JS]  0703 Care transferred to Dr. Vicente Males at change of shift pending repeat CT head at 0810. Anticipate back to nursing facility if CT stable. [JS]    Clinical Course User Index [JS] Irean Hong, MD     ____________________________________________   FINAL CLINICAL IMPRESSION(S) / ED DIAGNOSES  Final diagnoses:  Fall, initial encounter  Intraparenchymal hematoma of brain, left, without loss of consciousness, initial encounter [facility])  Lower urinary tract infectious disease     ED Discharge Orders         Ordered    cephALEXin (KEFLEX) 250 MG capsule  3 times daily        12/04/20 0555          *Please note:  Angelica Collins was evaluated in Emergency Department on 12/04/2020 for the symptoms described in the history of present illness. She was evaluated in the context of the global COVID-19 pandemic, which necessitated consideration that the patient might be  at risk for infection with the SARS-CoV-2 virus that causes COVID-19. Institutional protocols and algorithms that pertain to the evaluation of patients at risk for COVID-19 are in a state of rapid change based on information released by regulatory bodies including the CDC and federal and state organizations. These policies and algorithms were followed during the patient's care in the ED.  Some ED evaluations and interventions may be delayed as a result of limited staffing during and the pandemic.*   Note:  This document was prepared using Dragon voice recognition software and may include unintentional dictation errors.   Paulette Blanch, MD 12/04/20 [phone]

## 2020-12-04 NOTE — ED Notes (Signed)
Pt resting in bed. resps unlabored.

## 2020-12-07 LAB — URINE CULTURE: Culture: >=100000 — AB

## 2020-12-09 LAB — CULTURE, BLOOD (ROUTINE X 2)
Culture: NO GROWTH
Culture: NO GROWTH
Special Requests: ADEQUATE
Special Requests: ADEQUATE

## 2020-12-24 ENCOUNTER — Emergency Department
Admission: EM | Admit: 2020-12-24 | Discharge: 2020-12-25 | Disposition: A | Discharge status: Home / Self Care | Payer: Medicare PPO | Attending: Emergency Medicine | Admitting: Emergency Medicine

## 2020-12-24 ENCOUNTER — Other Ambulatory Visit: Payer: Self-pay

## 2020-12-24 ENCOUNTER — Emergency Department: Payer: Medicare PPO

## 2020-12-24 DIAGNOSIS — F0390 Unspecified dementia without behavioral disturbance: Secondary | ICD-10-CM | POA: Diagnosis not present

## 2020-12-24 DIAGNOSIS — Y9389 Activity, other specified: Secondary | ICD-10-CM | POA: Diagnosis not present

## 2020-12-24 DIAGNOSIS — Y9289 Other specified places as the place of occurrence of the external cause: Secondary | ICD-10-CM | POA: Insufficient documentation

## 2020-12-24 DIAGNOSIS — S0990XA Unspecified injury of head, initial encounter: Secondary | ICD-10-CM | POA: Diagnosis not present

## 2020-12-24 DIAGNOSIS — Z7982 Long term (current) use of aspirin: Secondary | ICD-10-CM | POA: Diagnosis not present

## 2020-12-24 DIAGNOSIS — W228XXA Striking against or struck by other objects, initial encounter: Secondary | ICD-10-CM | POA: Insufficient documentation

## 2020-12-24 MED ORDER — ATORVASTATIN CALCIUM 20 MG PO TABS
10.0000 mg | ORAL_TABLET | Freq: Every day | ORAL | Status: DC
  Filled 2020-12-24: qty 1

## 2020-12-24 MED ORDER — SERTRALINE HCL 50 MG PO TABS
100.0000 mg | ORAL_TABLET | Freq: Every day | ORAL | Status: DC
  Administered 2020-12-25: 100 mg via ORAL
  Filled 2020-12-24: qty 2

## 2020-12-24 MED ORDER — ASPIRIN 81 MG PO CHEW
81.0000 mg | CHEWABLE_TABLET | Freq: Every day | ORAL | Status: DC
  Administered 2020-12-25: 81 mg via ORAL
  Filled 2020-12-24: qty 1

## 2020-12-24 NOTE — Discharge Instructions (Signed)
Patient can take Tylenol as needed for discomfort.

## 2020-12-24 NOTE — ED Triage Notes (Signed)
Pt from [facility], pt with witnessed fall per ems in shower. Pt states she struck  Head on rail. Ems states staff denied loc. Pt with hematoma noted to posterior skull. Pt with dementia, but denies complaints at th is time.

## 2020-12-24 NOTE — ED Provider Notes (Signed)
[facility]-EMERGENCY DEPARTMENT  ____________________________________________  Time seen: Approximately 10:56 PM  I have reviewed the triage vital signs and the nursing notes.   HISTORY  Chief Complaint Head Injury   Historian Patient     HPI Angelica Collins is a 86 y.o. female presents to the emergency department after patient had a witnessed fall.  Patient was in the shower and hit her head against a guardrail.  No loss of consciousness occurred.  There have been no changes in patient's baseline.  No vomiting.  Patient was transported to the emergency department by EMS and will board in the emergency department overnight due to [facility] weather.   Past Medical History:  Diagnosis Date   Cognitive impairment    Dementia (HCC)    Geographic tongue    Hypercholesteremia    Hyperlipidemia    Nonrheumatic mitral (valve) prolapse    Raynaud's syndrome without gangrene      Immunizations up to date:  Yes.     Past Medical History:  Diagnosis Date   Cognitive impairment    Dementia (HCC)    Geographic tongue    Hypercholesteremia    Hyperlipidemia    Nonrheumatic mitral (valve) prolapse    Raynaud's syndrome without gangrene     Patient Active Problem List   Diagnosis Date Noted   Sepsis (HCC) 03/26/2019    No past surgical history on file.  Prior to Admission medications   Medication Sig Start Date End Date Taking? Authorizing Provider  acetaminophen (TYLENOL) 325 MG tablet Take 650 mg by mouth every 4 (four) hours. 05/31/18   [provider]  aspirin 81 MG chewable tablet Chew 81 mg by mouth daily.    [provider]  atorvastatin (LIPITOR) 10 MG tablet Take 10 mg by mouth daily.    [provider]  calcium-vitamin D (OSCAL WITH D) 500-200 MG-UNIT tablet Take 2 tablets by mouth daily.    [provider]  cephALEXin (KEFLEX) 250 MG capsule Take 1 capsule (250 mg total) by mouth 3 (three) times daily. 12/04/20   Irean Hong, MD  Polyethylene Glycol 3350 POWD 17 g daily as needed (constipation).     [provider]  sertraline (ZOLOFT) 100 MG tablet Take 100 mg by mouth daily. 01/18/19   [provider]    Allergies Patient has no known allergies.  No family history on file.  Social History Social History   Tobacco Use   Smoking status: Never Smoker   Smokeless tobacco: Never Used  Building services engineer Use: Never used  Substance Use Topics   Alcohol use: No   Drug use: No     Review of Systems  Constitutional: No fever/chills Eyes:  No discharge ENT: No upper respiratory complaints. Respiratory: no cough. No SOB/ use of accessory muscles to breath Gastrointestinal:   No nausea, no vomiting.  No diarrhea.  No constipation. Musculoskeletal: Negative for musculoskeletal pain. Skin: Negative for rash, abrasions, lacerations, ecchymosis.   ____________________________________________   PHYSICAL EXAM:  VITAL SIGNS: ED Triage Vitals  Enc Vitals Group     BP 12/24/20 2040 (!) 168/75     Pulse Rate 12/24/20 2040 95     Resp 12/24/20 2040 16     Temp 12/24/20 2040 98.6 F (37 C)     Temp Source 12/24/20 2040 Oral     SpO2 12/24/20 2040 98 %     Weight 12/24/20 2041 108 lb (49 kg)     Height 12/24/20 2041 5'  1" (1.549 m)     Head Circumference --      Peak Flow --      Pain Score 12/24/20 2041 0     Pain Loc --      Pain Edu? --      Excl. in GC? --      Constitutional: Alert and oriented. Well appearing and in no acute distress. Eyes: Conjunctivae are normal. PERRL. EOMI. Head: Atraumatic. ENT:      Nose: No congestion/rhinnorhea.      Mouth/Throat: Mucous membranes are moist.  Neck: No stridor.  Full range of motion.   Cardiovascular: Normal rate, regular rhythm. Normal S1 and S2.  Good peripheral circulation. Respiratory: Normal respiratory effort without tachypnea or retractions. Lungs CTAB. Good air entry to the bases with no decreased or absent  breath sounds Gastrointestinal: Bowel sounds x 4 quadrants. Soft and nontender to palpation. No guarding or rigidity. No distention. Musculoskeletal: Full range of motion to all extremities. No obvious deformities noted Neurologic:  Normal for age. No gross focal neurologic deficits are appreciated.  Skin:  Skin is warm, dry and intact. No rash noted. Psychiatric: Mood and affect are normal for age. Speech and behavior are normal.   ____________________________________________   LABS (all labs ordered are listed, but only abnormal results are displayed)  Labs Reviewed - No data to display ____________________________________________  EKG   ____________________________________________  RADIOLOGY Geraldo Pitter, personally viewed and evaluated these images (plain radiographs) as part of my medical decision making, as well as reviewing the written report by the radiologist.  CT Head Wo Contrast  Result Date: 12/24/2020 CLINICAL DATA:  Witnessed fall. Struck head. Neck trauma. No loss of consciousness. Hematoma to the posterior skull. EXAM: CT HEAD WITHOUT CONTRAST CT CERVICAL SPINE WITHOUT CONTRAST TECHNIQUE: Multidetector CT imaging of the head and cervical spine was performed following the standard protocol without intravenous contrast. Multiplanar CT image reconstructions of the cervical spine were also generated. COMPARISON:  02/25/2020 FINDINGS: CT HEAD FINDINGS Brain: Diffuse cerebral atrophy. Moderate ventricular dilatation consistent with central atrophy. Low-attenuation changes in the deep white matter consistent with small vessel ischemia. No mass-effect or midline shift. No abnormal extra-axial fluid collections. The gray-white matter junctions are distinct. Basal cisterns are not effaced. No evidence of acute intracranial hemorrhage. Vascular: Moderate intracranial arterial vascular calcifications. Skull: Calvarium appears intact. Small subcutaneous scalp hematoma over the left  inferior posterior occipital region. Sinuses/Orbits: Paranasal sinuses and mastoid air cells are clear. Other: None. CT CERVICAL SPINE FINDINGS Alignment: Normal alignment.  C1-2 articulation appears intact. Skull base and vertebrae: Skull base appears intact. No vertebral compression deformities. No focal bone lesion or bone destruction. Soft tissues and spinal canal: No prevertebral soft tissue swelling. No abnormal paraspinal soft tissue mass or infiltration. Disc levels: Degenerative changes throughout the cervical spine with narrowed interspaces and endplate hypertrophic change. Upper chest: Scarring in the lung apices. Other: None. IMPRESSION: 1. No acute intracranial abnormalities. Chronic atrophy and small vessel ischemic changes. 2. Normal alignment of the cervical spine. Degenerative changes. No acute displaced fractures identified. Electronically Signed   By: Burman Nieves M.D.   On: 12/24/2020 21:15   CT Cervical Spine Wo Contrast  Result Date: 12/24/2020 CLINICAL DATA:  Witnessed fall. Struck head. Neck trauma. No loss of consciousness. Hematoma to the posterior skull. EXAM: CT HEAD WITHOUT CONTRAST CT CERVICAL SPINE WITHOUT CONTRAST TECHNIQUE: Multidetector CT imaging of the head and cervical spine was performed following the standard protocol without intravenous contrast.  Multiplanar CT image reconstructions of the cervical spine were also generated. COMPARISON:  02/25/2020 FINDINGS: CT HEAD FINDINGS Brain: Diffuse cerebral atrophy. Moderate ventricular dilatation consistent with central atrophy. Low-attenuation changes in the deep white matter consistent with small vessel ischemia. No mass-effect or midline shift. No abnormal extra-axial fluid collections. The gray-white matter junctions are distinct. Basal cisterns are not effaced. No evidence of acute intracranial hemorrhage. Vascular: Moderate intracranial arterial vascular calcifications. Skull: Calvarium appears intact. Small subcutaneous  scalp hematoma over the left inferior posterior occipital region. Sinuses/Orbits: Paranasal sinuses and mastoid air cells are clear. Other: None. CT CERVICAL SPINE FINDINGS Alignment: Normal alignment.  C1-2 articulation appears intact. Skull base and vertebrae: Skull base appears intact. No vertebral compression deformities. No focal bone lesion or bone destruction. Soft tissues and spinal canal: No prevertebral soft tissue swelling. No abnormal paraspinal soft tissue mass or infiltration. Disc levels: Degenerative changes throughout the cervical spine with narrowed interspaces and endplate hypertrophic change. Upper chest: Scarring in the lung apices. Other: None. IMPRESSION: 1. No acute intracranial abnormalities. Chronic atrophy and small vessel ischemic changes. 2. Normal alignment of the cervical spine. Degenerative changes. No acute displaced fractures identified. Electronically Signed   By: Burman Nieves M.D.   On: 12/24/2020 21:15    ____________________________________________    PROCEDURES  Procedure(s) performed:     Procedures     Medications  aspirin chewable tablet 81 mg (has no administration in time range)  atorvastatin (LIPITOR) tablet 10 mg (has no administration in time range)  sertraline (ZOLOFT) tablet 100 mg (has no administration in time range)     ____________________________________________   INITIAL IMPRESSION / ASSESSMENT AND PLAN / ED COURSE  Pertinent labs & imaging results that were available during my care of the patient were reviewed by me and considered in my medical decision making (see chart for details).      Assessment and plan Fall 86 year old female presents to the emergency department after patient had a mechanical fall and hit her head against a guardrail.  Patient was mildly hypertensive at triage but vital signs were otherwise reassuring.  Patient did have a palpable occipital hematoma but no abrasions or lacerations.  CT head and  CT cervical spine revealed no evidence of intracranial bleed, skull fracture or C-spine fracture.  Patient will board in the emergency department overnight due to inclement weather and lack of EMS transport back to [facility].  Patient's home medications were ordered.     ____________________________________________  FINAL CLINICAL IMPRESSION(S) / ED DIAGNOSES  Final diagnoses:  Injury of head, initial encounter      NEW MEDICATIONS STARTED DURING THIS VISIT:  ED Discharge Orders    None          This chart was dictated using voice recognition software/Dragon. Despite best efforts to proofread, errors can occur which can change the meaning. Any change was purely unintentional.     Gasper Lloyd 12/24/20 2259    Jene Every, MD 12/24/20 2259

## 2020-12-25 NOTE — ED Notes (Signed)
Called pt family to see if they could transport her back to [facility]  Pt nephew will come to get her now

## 2020-12-25 NOTE — ED Notes (Signed)
Regular diet ordered for pt  Explained to pt POC and no transportation today to return to [facility]

## 2020-12-25 NOTE — ED Notes (Signed)
Pt sleeping at this time. NAD noted.

## 2021-10-03 IMAGING — CT CT CERVICAL SPINE W/O CM
3 series · 13 of 27 positions shown, 16 images · non-contrast
Comparison: 02/25/2020

CLINICAL DATA: Witnessed fall. Struck head. Neck trauma. No loss of
consciousness. Hematoma to the posterior skull.

EXAM:
CT HEAD WITHOUT CONTRAST
CT CERVICAL SPINE WITHOUT CONTRAST
TECHNIQUE: Multidetector CT imaging of the head and cervical spine was
performed following the standard protocol without intravenous
contrast. Multiplanar CT image reconstructions of the cervical spine
were also generated.

[Series 3: c spine soft · axial · 0.32mm/px · z∈[-233,-183]mm · 3 of 76 slices shown]
[im 13/76  soft-tissue]
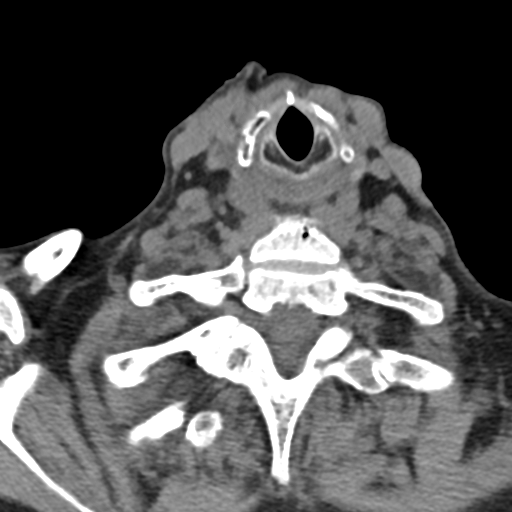
[im 26/76  soft-tissue]
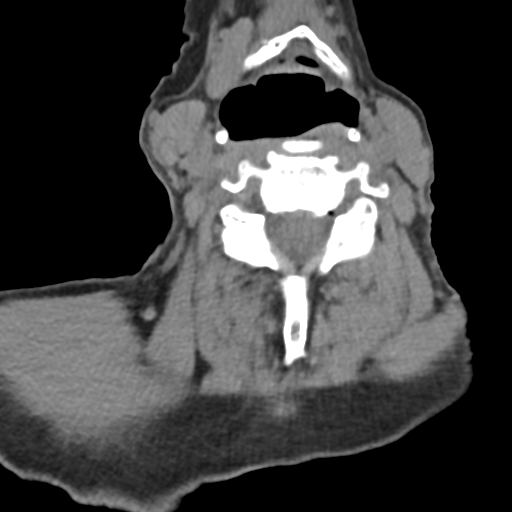
[im 38/76  soft-tissue]
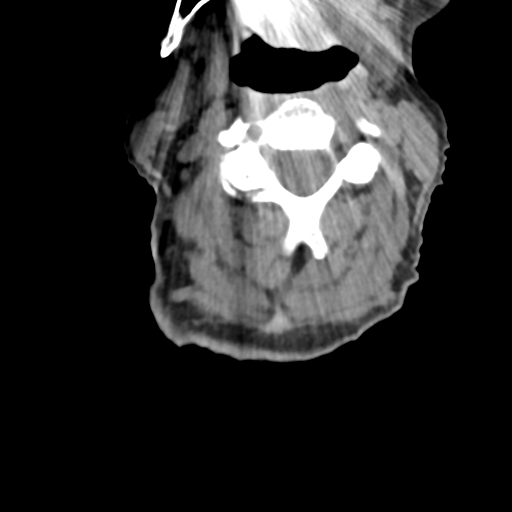

[Series 4: sagittal bone · sagittal · 0.25mm/px · 5 of 43 slices shown, 6 images]
[im 15/43  bone]
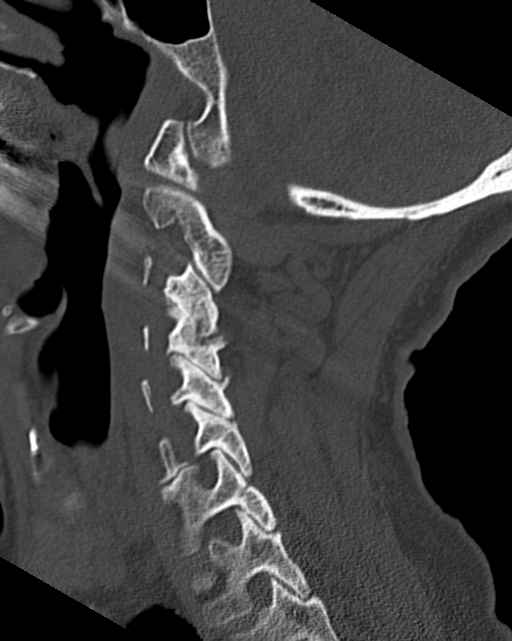
[im 18/43  bone]
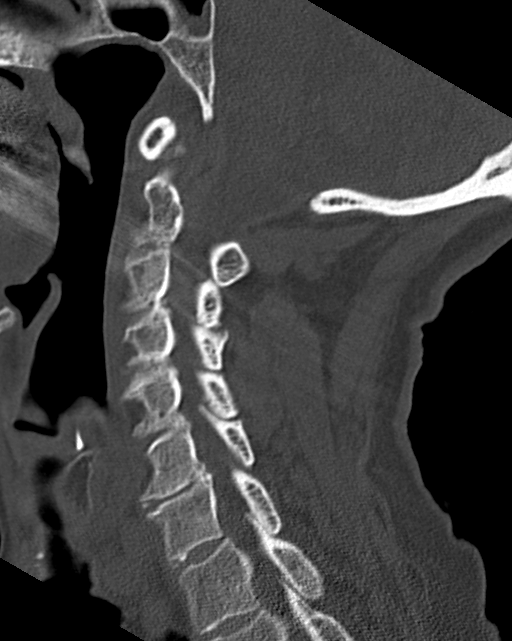
[im 22/43  soft-tissue]
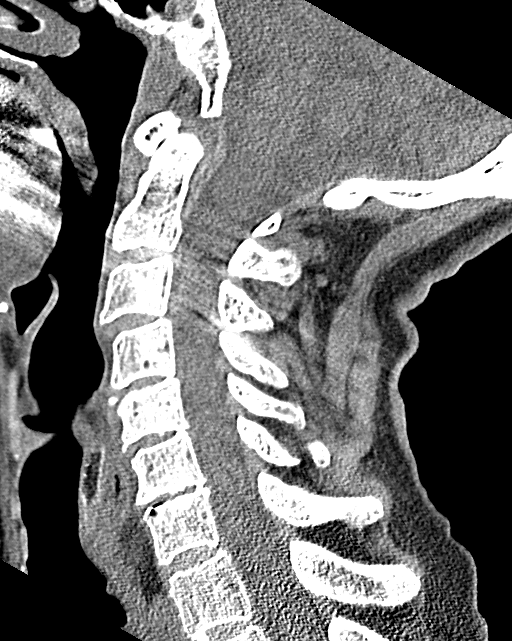
[im 22/43  bone]
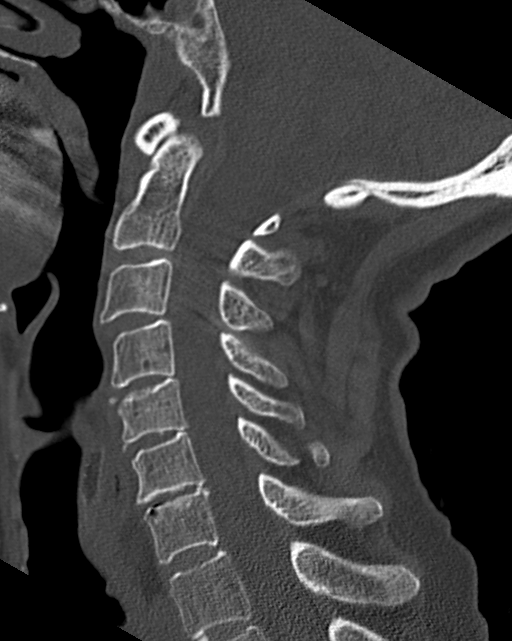
[im 25/43  bone]
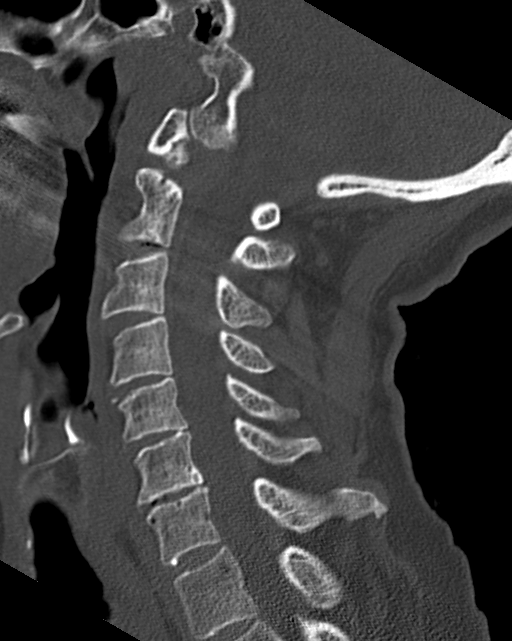
[im 29/43  bone]
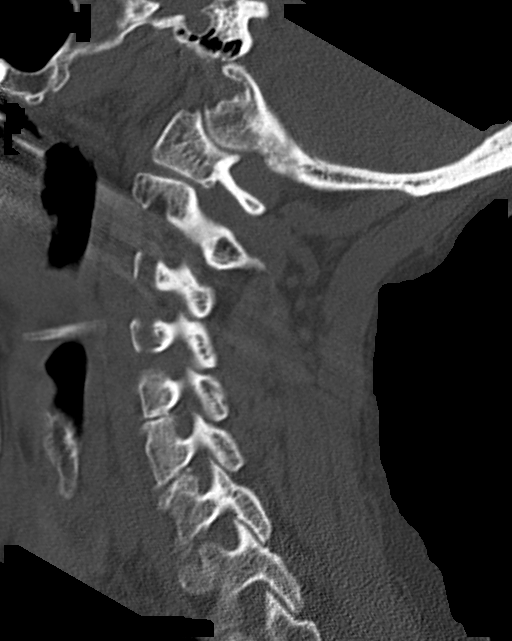

[Series 6: orthogonal bone · axial · 0.20mm/px · z∈[-259,-174]mm · 5 of 80 slices shown, 7 images]
[im 14/80  soft-tissue]
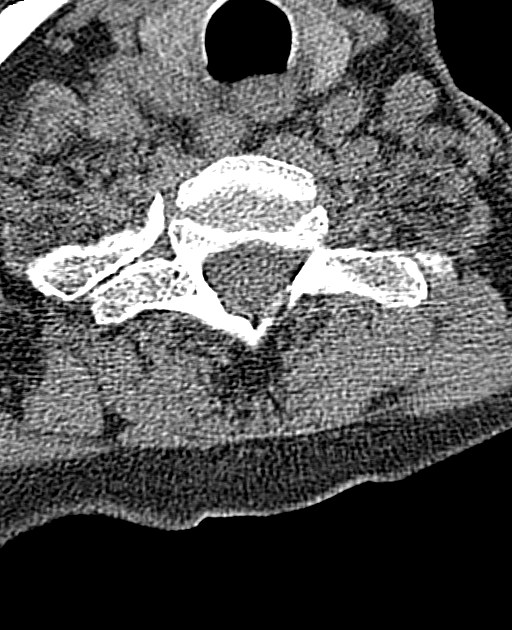
[im 14/80  bone]
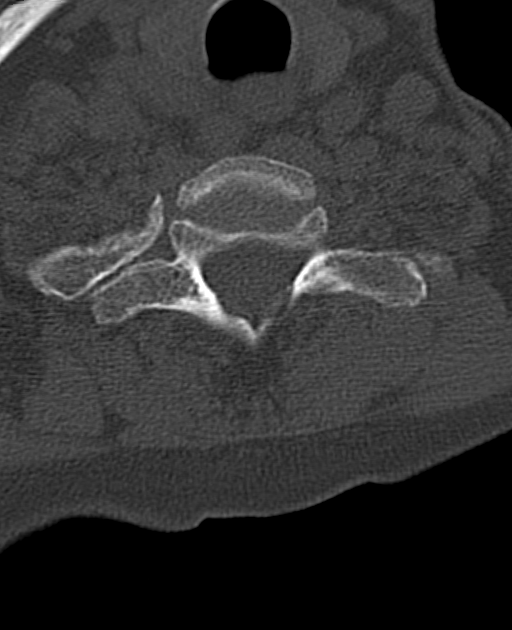
[im 27/80  bone]
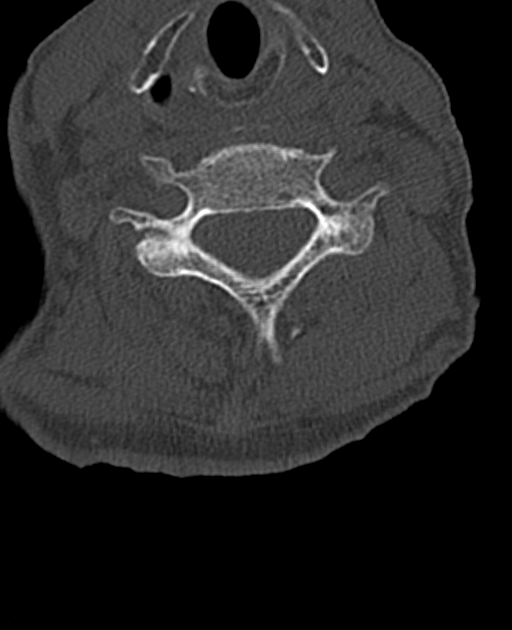
[im 40/80  bone]
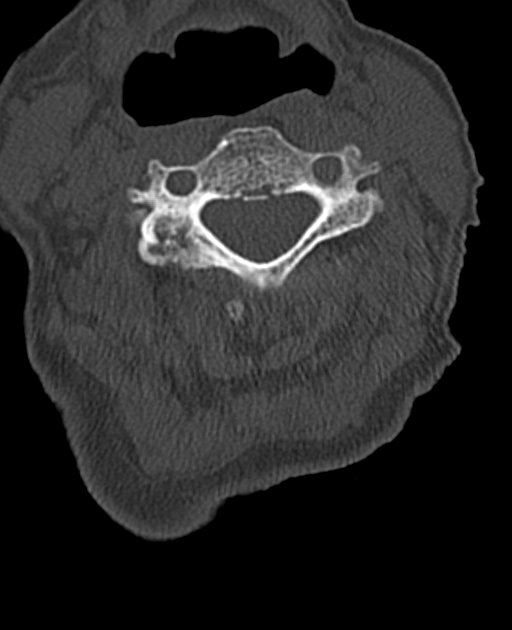
[im 53/80  bone]
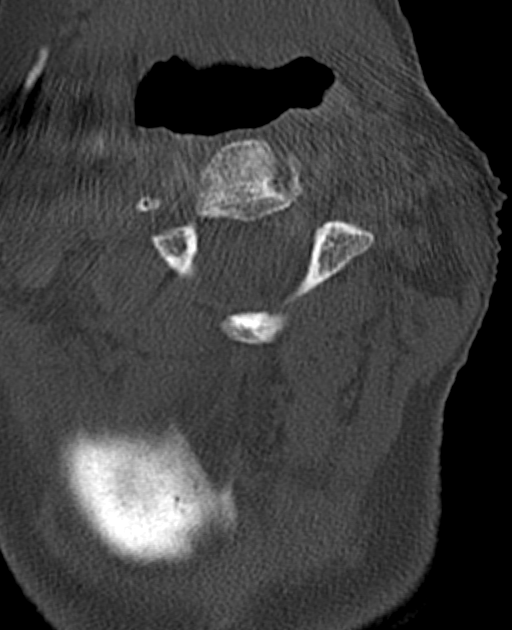
[im 66/80  soft-tissue]
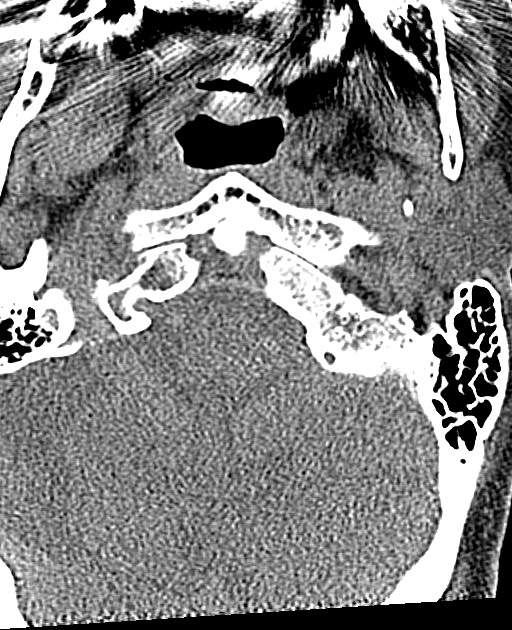
[im 66/80  bone]
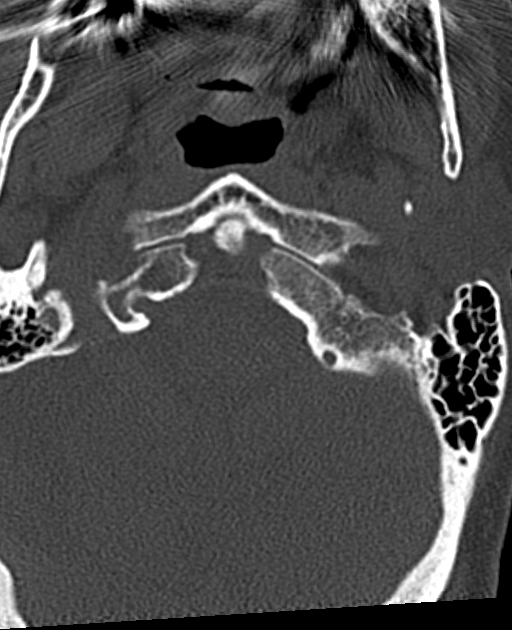

[13 of 27 positions shown; findings below may reference images not displayed]

FINDINGS: CT HEAD FINDINGS

Brain: Diffuse cerebral atrophy. Moderate ventricular dilatation
consistent with central atrophy. Low-attenuation changes in the deep
white matter consistent with small vessel ischemia. No mass-effect
or midline shift. No abnormal extra-axial fluid collections. The
gray-white matter junctions are distinct. Basal cisterns are not
effaced. No evidence of acute intracranial hemorrhage.

Vascular: Moderate intracranial arterial vascular calcifications.

Skull: Calvarium appears intact. Small subcutaneous scalp hematoma
over the left inferior posterior occipital region.

Sinuses/Orbits: Paranasal sinuses and mastoid air cells are clear.

Other: None.

CT CERVICAL SPINE FINDINGS

Alignment: Normal alignment.  C1-2 articulation appears intact.

Skull base and vertebrae: Skull base appears intact. No vertebral
compression deformities. No focal bone lesion or bone destruction.

Soft tissues and spinal canal: No prevertebral soft tissue swelling.
No abnormal paraspinal soft tissue mass or infiltration.

Disc levels: Degenerative changes throughout the cervical spine with
narrowed interspaces and endplate hypertrophic change.

Upper chest: Scarring in the lung apices.

Other: None.
IMPRESSION: 1. No acute intracranial abnormalities. Chronic atrophy and small
vessel ischemic changes.
2. Normal alignment of the cervical spine. Degenerative changes. No
acute displaced fractures identified.

## 2023-06-27 ENCOUNTER — Emergency Department: Payer: Medicare PPO

## 2023-06-27 ENCOUNTER — Emergency Department
Admission: EM | Admit: 2023-06-27 | Discharge: 2023-06-27 | Disposition: A | Discharge status: Home / Self Care | Payer: Medicare PPO | Source: Home / Self Care | Attending: Emergency Medicine | Admitting: Emergency Medicine

## 2023-06-27 ENCOUNTER — Other Ambulatory Visit: Payer: Self-pay

## 2023-06-27 DIAGNOSIS — T17908A Unspecified foreign body in respiratory tract, part unspecified causing other injury, initial encounter: Secondary | ICD-10-CM

## 2023-06-27 DIAGNOSIS — X58XXXA Exposure to other specified factors, initial encounter: Secondary | ICD-10-CM | POA: Insufficient documentation

## 2023-06-27 DIAGNOSIS — F0390 Unspecified dementia without behavioral disturbance: Secondary | ICD-10-CM | POA: Diagnosis not present

## 2023-06-27 DIAGNOSIS — T17920A Food in respiratory tract, part unspecified causing asphyxiation, initial encounter: Secondary | ICD-10-CM | POA: Diagnosis not present

## 2023-06-27 MED ORDER — AMOXICILLIN-POT CLAVULANATE 875-125 MG PO TABS: 1.0000 | ORAL_TABLET | Freq: Two times a day (BID) | ORAL | 0 refills | Status: AC

## 2023-06-27 MED ORDER — AMOXICILLIN-POT CLAVULANATE 875-125 MG PO TABS
1.0000 | ORAL_TABLET | Freq: Once | ORAL | Status: AC
  Administered 2023-06-27: 1 via ORAL
  Filled 2023-06-27: qty 1

## 2023-06-27 NOTE — ED Provider Notes (Signed)
[facility] Provider Note    Event Date/Time   First MD Initiated Contact with Patient 06/27/23 1140     (approximate)   History   Chief Complaint possible aspiration   HPI  Angelica Collins is a 88 y.o. female with past medical history of hyperlipidemia and dementia who presents to the ED for possible aspiration.  Per EMS, staff at [facility] assisted living was concerned that patient choked on her medications earlier this morning and wanted her evaluated.  Patient states that she had difficulty swallowing one of her pills and had to cough it back up, has been dealing with a cough since then.  She has not had any difficulty breathing and denies any pain in her chest, has not had any fevers.  She states that she feels well and is requesting to be discharged home.  Niece at bedside states patient is at her baseline mental status.     Physical Exam   Triage Vital Signs: ED Triage Vitals  Encounter Vitals Group     BP 06/27/23 1027 (!) 146/74     Systolic BP Percentile --      Diastolic BP Percentile --      Pulse Rate 06/27/23 1027 94     Resp 06/27/23 1027 16     Temp 06/27/23 1027 98.8 F (37.1 C)     Temp Source 06/27/23 1027 Oral     SpO2 06/27/23 1027 93 %     Weight 06/27/23 1028 145 lb (65.8 kg)     Height 06/27/23 1028 5\' 3"  (1.6 m)     Head Circumference --      Peak Flow --      Pain Score --      Pain Loc --      Pain Education --      Exclude from Growth Chart --     Most recent vital signs: Vitals:   06/27/23 1027  BP: (!) 146/74  Pulse: 94  Resp: 16  Temp: 98.8 F (37.1 C)  SpO2: 93%    Constitutional: Alert and oriented. Eyes: Conjunctivae are normal. Head: Atraumatic. Nose: No congestion/rhinnorhea. Mouth/Throat: Mucous membranes are moist.  Cardiovascular: Normal rate, regular rhythm. Grossly normal heart sounds.  2+ radial pulses bilaterally. Respiratory: Normal respiratory effort.  No retractions. Lungs  CTAB. Gastrointestinal: Soft and nontender. No distention. Musculoskeletal: No lower extremity tenderness nor edema.  Neurologic:  Normal speech and language. No gross focal neurologic deficits are appreciated.    ED Results / Procedures / Treatments   Labs (all labs ordered are listed, but only abnormal results are displayed) Labs Reviewed - No data to display  RADIOLOGY Chest x-ray reviewed and interpreted by me with small left-sided effusion, no focal infiltrate or edema noted.  PROCEDURES:  Critical Care performed: No  Procedures   MEDICATIONS ORDERED IN ED: Medications  amoxicillin-clavulanate (AUGMENTIN) 875-125 MG per tablet 1 tablet (has no administration in time range)     IMPRESSION / MDM / ASSESSMENT AND PLAN / ED COURSE  I reviewed the triage vital signs and the nursing notes.                              88 y.o. female with past medical history of hyperlipidemia and dementia who presents to the ED following a coughing episode while attempting to take her pills this morning.  Patient's presentation is most consistent with acute complicated illness /  injury requiring diagnostic workup.  Differential diagnosis includes, but is not limited to, aspiration, pneumonia, pleural effusion, pulmonary edema.  Patient nontoxic-appearing and in no acute distress, vital signs are unremarkable.  Chest x-ray shows small left-sided effusion with associated atelectasis, there is also possible pulmonary nodule noted.  History concerning for aspiration and patient at high risk to develop aspiration pneumonia.  We will start her on antibiotics for this but she is otherwise appropriate for outpatient management given no difficulty breathing.  She was given initial dose of Augmentin here in the ED, also provided with referral to the pulmonary nodule clinic for outpatient CT imaging.  She was counseled to return to the ED for new or worsening symptoms, niece agrees with plan       FINAL CLINICAL IMPRESSION(S) / ED DIAGNOSES   Final diagnoses:  Aspiration into airway, initial encounter     Rx / DC Orders   ED Discharge Orders          Ordered    AMB  Referral to Pulmonary Nodule Clinic        06/27/23 1209    amoxicillin-clavulanate (AUGMENTIN) 875-125 MG tablet  2 times daily        06/27/23 1209             Note:  This document was prepared using Dragon voice recognition software and may include unintentional dictation errors.   Chesley Noon, MD 06/27/23 1213

## 2023-06-27 NOTE — ED Triage Notes (Signed)
First nurse note: Arrived by EMS from [facility]. Reports she started to get choked on her morning medications per staff. Staff wanted patient evaluated. Patient denies any complaints per EMS.  EMS vitals: 165/50b/p 95HR 94% RA

## 2023-06-27 NOTE — ED Triage Notes (Signed)
Pt to ED with EMS from [facility] to be evaluated for possible aspiration of morning meds. Niece at bedside, states pt mildly confused at baseline. Pt in NAD, respirations unlabored. No cough noted. Disoriented to time ("1934"). Pt in NAD.

## 2023-06-27 NOTE — ED Notes (Signed)
[facility]  CALLED   FOR  TRANSPORT  TO [facility] 

## 2023-07-13 ENCOUNTER — Other Ambulatory Visit: Payer: Self-pay | Admitting: Family

## 2023-07-13 DIAGNOSIS — R058 Other specified cough: Secondary | ICD-10-CM

## 2023-07-13 DIAGNOSIS — W44F3XA Food entering into or through a natural orifice, initial encounter: Secondary | ICD-10-CM

## 2023-07-25 ENCOUNTER — Ambulatory Visit
Admission: RE | Admit: 2023-07-25 | Discharge: 2023-07-25 | Disposition: A | Discharge status: Home / Self Care | Payer: Medicare PPO | Source: Ambulatory Visit | Attending: Family | Admitting: Family

## 2023-07-25 DIAGNOSIS — R4189 Other symptoms and signs involving cognitive functions and awareness: Secondary | ICD-10-CM | POA: Diagnosis not present

## 2023-07-25 DIAGNOSIS — K225 Diverticulum of esophagus, acquired: Secondary | ICD-10-CM | POA: Diagnosis not present

## 2023-07-25 DIAGNOSIS — T17928A Food in respiratory tract, part unspecified causing other injury, initial encounter: Secondary | ICD-10-CM | POA: Diagnosis not present

## 2023-07-25 DIAGNOSIS — W44F3XA Food entering into or through a natural orifice, initial encounter: Secondary | ICD-10-CM | POA: Diagnosis not present

## 2023-07-25 DIAGNOSIS — R058 Other specified cough: Secondary | ICD-10-CM | POA: Insufficient documentation

## 2023-07-25 NOTE — Progress Notes (Signed)
Modified Barium Swallow Study  Patient Details  Name: Angelica Collins MRN: [id number] Date of Birth: 1935/08/11  Today's Date: 07/25/2023  Modified Barium Swallow completed.  Full report located under Chart Review in the Imaging Section.  History of Present Illness Per ED note: 06/2023: "pt is a 88 y.o. female with past medical history of Dementia, Falls, hyperlipidemia who presents to the ED for possible aspiration.  Per EMS, staff at [facility] assisted living was concerned that patient choked on her medications earlier this morning and wanted her evaluated.  Patient states that she had difficulty swallowing one of her pills and had to cough it back up, has been dealing with a cough since then.  She has not had any difficulty breathing and denies any pain in her chest, has not had any fevers.  She states that she feels well and is requesting to be discharged home.  Niece at bedside states patient is at her baseline mental status.".  Pt resides at [facility]. She has been eating a Comptroller diet w/ thin liquids per report/description and denies any current swallowing issues.   CXR on 06/2023: Hiatal hernia, Faint nodular density right mid thorax.  No other imaging since 2022 - negative then.  Head CT 2022: No acute intracranial abnormalities. Chronic atrophy and small  vessel ischemic changes and volume loss.   Clinical Impression Patient presents with grossly functional oropharyngeal phase swallowing function for age; MOD Esophageal phase Dysmotility/Dysphagia in setting of apparent posterior Cervical Esophageal ZENKER'S DIVERTICULUM.  ANY Esophageal phase deficits can impact the oropharyngeal phase of swallowing and increase risk for aspiration of REFLUX material from the Esophagus.   No laryngeal penetration nor aspiration occurred during this study.        Oral stage is characterized by adequate lip closure, bolus preparation and containment for age, and anterior to posterior transit. Swallow  initiation occurs primarily at the level of the valleculae for majority of trials; posterior laryngeal surfact of epiglottis for thin liquids inconsistently - expected for age.    Pharyngeal stage is noted for functional tongue base retraction, adequate hyolaryngeal excursion, and adequate pharyngeal constriction. Pharyngeal stripping wave is complete. Epiglottic inversion is complete. No laryngeal penetration nor aspiration occurred during all trials. No pharyngeal residue noted immediately post initial swallow. The min+ pyriform sinus residue appeared to occur d/t the Retrograde flow through the PES from the Esophagus/Zenker's Diverticulum. This residue appeared to clear w/ use of f/u, DRY SWALLOW b/t trials. (The pyriform sinuses had bolus material fill them from the Retrograde flow of bolus material from the [facility] DIVERTICULUM. Again, the DRY SWALLOW b/t trials cleared the majority of pharyngeal residue -- it did not fully clear the bolus residue viewed in the [facility] DIVERTICULUM.)     Amplitude/duration of cricopharyngeus opening appeared [facility]. Adequate/complete clearance of bolus material through the upper Cervical Esophagus was hampered d/t the [facility] DIVERTICULUM.   A 13 mm barium tablet given in PUREE cleared the pharyngoesophageal areas including passing anteriorly of the ZENKER"S DIVERTICULUM. Other consistencies tested were thin liquids x1 tsps, 4 cup sips, nectar x1 tsp, 1 cup sip, honey x1 tsp, pudding x1 tsp, regular solid (1/2 graham cracker with pudding),   Pt and Niece were educated on the results of the study including the [facility] DIVERTICULUM AND ITS IMPACT ON SWALLOWING AND FOOD/PILLS "GETTING STUCK"; and on general aspiration precautions including the strategy of f/u DRY SWALLOW b/t bites/sips; AND to use APPLESAUCE when swallowing her Pills/Medications for safer swallowing(this was the  complaint in July 2024 prompting this study and ED admit per report). Pt demonstrated follow  through w/ use of strategies/precautions and indicated verbal understanding of such given cues. Niece agreed. Factors that may increase risk of adverse event in presence of aspiration [employer] 2021): Frail or deconditioned (Dementia per chart)   Swallow Evaluation Recommendations Recommendations: PO diet PO Diet Recommendation: Dysphagia 3 (Mechanical soft);Thin liquids (Level 0) (assess tolerance for food individually such as baked potatoes/sweet potatoes to liberalize diet and not be too restrictive, as able) Liquid Administration via: Cup;No straw Medication Administration: Crushed with puree (as able vs Whole in puree) Supervision: Patient able to self-feed;Intermittent supervision/cueing for swallowing strategies (for follow through w/ cues/precautions) Swallowing strategies  : Minimize environmental distractions;Slow rate;Small bites/sips;Multiple dry swallows after each bite/sip;Follow solids with liquids Postural changes: Position pt fully upright for meals;Stay upright 30-60 min after meals;Out of bed for meals (REFLUX precautions) Oral care recommendations: Oral care BID (2x/day);Staff/trained caregiver to provide oral care (support) Recommended consults: Consider GI consultation;Consider esophageal assessment (for further Education); Dietician f/u if needed Caregiver Recommendations:  (n/a)       Jerilynn Som, MS, CCC-SLP Speech Language Pathologist Rehab Services; [facility] - [place] [phone] (ascom) Llewellyn Schoenberger 07/25/2023,5:48 PM

## 2023-08-20 ENCOUNTER — Emergency Department: Payer: Medicare PPO

## 2023-08-20 ENCOUNTER — Other Ambulatory Visit: Payer: Self-pay

## 2023-08-20 ENCOUNTER — Observation Stay
Admission: EM | Admit: 2023-08-20 | Discharge: 2023-08-24 | Disposition: A | Discharge status: Skilled Nursing Facility | Payer: Medicare PPO | Attending: Internal Medicine | Admitting: Internal Medicine

## 2023-08-20 DIAGNOSIS — M6281 Muscle weakness (generalized): Secondary | ICD-10-CM | POA: Diagnosis not present

## 2023-08-20 DIAGNOSIS — R1310 Dysphagia, unspecified: Secondary | ICD-10-CM | POA: Diagnosis not present

## 2023-08-20 DIAGNOSIS — J189 Pneumonia, unspecified organism: Secondary | ICD-10-CM | POA: Diagnosis not present

## 2023-08-20 DIAGNOSIS — K219 Gastro-esophageal reflux disease without esophagitis: Secondary | ICD-10-CM | POA: Diagnosis present

## 2023-08-20 DIAGNOSIS — D72829 Elevated white blood cell count, unspecified: Secondary | ICD-10-CM | POA: Diagnosis not present

## 2023-08-20 DIAGNOSIS — F32A Depression, unspecified: Secondary | ICD-10-CM | POA: Diagnosis present

## 2023-08-20 DIAGNOSIS — R4182 Altered mental status, unspecified: Secondary | ICD-10-CM | POA: Diagnosis present

## 2023-08-20 DIAGNOSIS — Z79899 Other long term (current) drug therapy: Secondary | ICD-10-CM | POA: Diagnosis not present

## 2023-08-20 DIAGNOSIS — F0390 Unspecified dementia without behavioral disturbance: Secondary | ICD-10-CM | POA: Insufficient documentation

## 2023-08-20 DIAGNOSIS — Z1152 Encounter for screening for COVID-19: Secondary | ICD-10-CM | POA: Insufficient documentation

## 2023-08-20 DIAGNOSIS — E785 Hyperlipidemia, unspecified: Secondary | ICD-10-CM | POA: Diagnosis present

## 2023-08-20 DIAGNOSIS — Z7982 Long term (current) use of aspirin: Secondary | ICD-10-CM | POA: Insufficient documentation

## 2023-08-20 DIAGNOSIS — R531 Weakness: Secondary | ICD-10-CM

## 2023-08-20 DIAGNOSIS — R4781 Slurred speech: Secondary | ICD-10-CM

## 2023-08-20 DIAGNOSIS — E860 Dehydration: Secondary | ICD-10-CM | POA: Diagnosis not present

## 2023-08-20 DIAGNOSIS — R2689 Other abnormalities of gait and mobility: Secondary | ICD-10-CM | POA: Insufficient documentation

## 2023-08-20 DIAGNOSIS — R4189 Other symptoms and signs involving cognitive functions and awareness: Secondary | ICD-10-CM | POA: Diagnosis present

## 2023-08-20 LAB — MAGNESIUM: Magnesium: 2.4 mg/dL (ref 1.7–2.4)

## 2023-08-20 LAB — CBC
HCT: 48.4 % — ABNORMAL HIGH (ref 36.0–46.0)
Hemoglobin: 15.4 g/dL — ABNORMAL HIGH (ref 12.0–15.0)
MCH: 28.4 pg (ref 26.0–34.0)
MCHC: 31.8 g/dL (ref 30.0–36.0)
MCV: 89.1 fL (ref 80.0–100.0)
Platelets: 299 K/uL (ref 150–400)
RBC: 5.43 MIL/uL — ABNORMAL HIGH (ref 3.87–5.11)
RDW: 14.2 % (ref 11.5–15.5)
WBC: 16.1 K/uL — ABNORMAL HIGH (ref 4.0–10.5)
nRBC: 0 % (ref 0.0–0.2)

## 2023-08-20 LAB — COMPREHENSIVE METABOLIC PANEL WITH GFR
ALT: 21 U/L (ref 0–44)
AST: 27 U/L (ref 15–41)
Albumin: 3.6 g/dL (ref 3.5–5.0)
Alkaline Phosphatase: 78 U/L (ref 38–126)
Anion gap: 8 (ref 5–15)
BUN: 22 mg/dL (ref 8–23)
CO2: 26 mmol/L (ref 22–32)
Calcium: 8.9 mg/dL (ref 8.9–10.3)
Chloride: 103 mmol/L (ref 98–111)
Creatinine, Ser: 1.1 mg/dL — ABNORMAL HIGH (ref 0.44–1.00)
GFR, Estimated: 49 mL/min — ABNORMAL LOW
Glucose, Bld: 106 mg/dL — ABNORMAL HIGH (ref 70–99)
Potassium: 4.8 mmol/L (ref 3.5–5.1)
Sodium: 137 mmol/L (ref 135–145)
Total Bilirubin: 0.5 mg/dL (ref 0.3–1.2)
Total Protein: 7.5 g/dL (ref 6.5–8.1)

## 2023-08-20 LAB — PROCALCITONIN: Procalcitonin: <0.1 ng/mL

## 2023-08-20 LAB — TROPONIN I (HIGH SENSITIVITY): Troponin I (High Sensitivity): 5 ng/L

## 2023-08-20 LAB — RESP PANEL BY RT-PCR (RSV, FLU A&B, COVID)  RVPGX2
Influenza A by PCR: NEGATIVE
Influenza B by PCR: NEGATIVE
Resp Syncytial Virus by PCR: NEGATIVE
SARS Coronavirus 2 by RT PCR: NEGATIVE

## 2023-08-20 LAB — LACTIC ACID, PLASMA: Lactic Acid, Venous: 0.8 mmol/L (ref 0.5–1.9)

## 2023-08-20 MED ORDER — ACETAMINOPHEN 650 MG RE SUPP: 650.0000 mg | Freq: Four times a day (QID) | RECTAL | Status: DC | PRN

## 2023-08-20 MED ORDER — SODIUM CHLORIDE 0.9 % IV SOLN: 2.0000 g | INTRAVENOUS | Status: DC

## 2023-08-20 MED ORDER — PANTOPRAZOLE SODIUM 40 MG PO TBEC
40.0000 mg | DELAYED_RELEASE_TABLET | Freq: Two times a day (BID) | ORAL | Status: DC
  Administered 2023-08-21 – 2023-08-24 (×8): 40 mg via ORAL
  Filled 2023-08-20 (×8): qty 1

## 2023-08-20 MED ORDER — ENOXAPARIN SODIUM 40 MG/0.4ML IJ SOSY
40.0000 mg | PREFILLED_SYRINGE | INTRAMUSCULAR | Status: DC
  Administered 2023-08-21 – 2023-08-23 (×4): 40 mg via SUBCUTANEOUS
  Filled 2023-08-20 (×4): qty 0.4

## 2023-08-20 MED ORDER — ASPIRIN 81 MG PO CHEW: 81.0000 mg | CHEWABLE_TABLET | Freq: Every day | ORAL | Status: DC

## 2023-08-20 MED ORDER — OYSTER SHELL CALCIUM/D3 500-5 MG-MCG PO TABS
2.0000 | ORAL_TABLET | Freq: Every day | ORAL | Status: DC
  Administered 2023-08-21 – 2023-08-24 (×4): 2 via ORAL
  Filled 2023-08-20 (×4): qty 2

## 2023-08-20 MED ORDER — ATORVASTATIN CALCIUM 20 MG PO TABS
20.0000 mg | ORAL_TABLET | Freq: Every day | ORAL | Status: DC
  Administered 2023-08-21 – 2023-08-24 (×4): 20 mg via ORAL
  Filled 2023-08-20 (×4): qty 1

## 2023-08-20 MED ORDER — SODIUM CHLORIDE 0.9 % IV SOLN: 500.0000 mg | INTRAVENOUS | Status: DC

## 2023-08-20 MED ORDER — ZINC OXIDE 11.3 % EX CREA: 1.0000 | TOPICAL_CREAM | Freq: Three times a day (TID) | CUTANEOUS | Status: DC | PRN

## 2023-08-20 MED ORDER — SODIUM CHLORIDE 0.9 % IV BOLUS
1000.0000 mL | Freq: Once | INTRAVENOUS | Status: AC
  Administered 2023-08-20: 1000 mL via INTRAVENOUS

## 2023-08-20 MED ORDER — SODIUM CHLORIDE 0.9 % IV BOLUS (SEPSIS)
1000.0000 mL | Freq: Once | INTRAVENOUS | Status: AC
  Administered 2023-08-20: 1000 mL via INTRAVENOUS

## 2023-08-20 MED ORDER — SENNOSIDES-DOCUSATE SODIUM 8.6-50 MG PO TABS: 1.0000 | ORAL_TABLET | Freq: Every evening | ORAL | Status: DC | PRN

## 2023-08-20 MED ORDER — ONDANSETRON HCL 4 MG PO TABS: 4.0000 mg | ORAL_TABLET | Freq: Four times a day (QID) | ORAL | Status: DC | PRN

## 2023-08-20 MED ORDER — FAMOTIDINE 20 MG PO TABS
10.0000 mg | ORAL_TABLET | Freq: Two times a day (BID) | ORAL | Status: DC
  Administered 2023-08-21 – 2023-08-24 (×8): 10 mg via ORAL
  Filled 2023-08-20 (×8): qty 1

## 2023-08-20 MED ORDER — BUSPIRONE HCL 15 MG PO TABS
7.5000 mg | ORAL_TABLET | Freq: Two times a day (BID) | ORAL | Status: DC
  Administered 2023-08-21 – 2023-08-24 (×8): 7.5 mg via ORAL
  Filled 2023-08-20: qty 2
  Filled 2023-08-20: qty 1
  Filled 2023-08-20: qty 2
  Filled 2023-08-20 (×5): qty 1

## 2023-08-20 MED ORDER — SODIUM CHLORIDE 0.9 % IV SOLN
2.0000 g | INTRAVENOUS | Status: DC
  Administered 2023-08-20: 2 g via INTRAVENOUS
  Filled 2023-08-20: qty 20

## 2023-08-20 MED ORDER — SODIUM CHLORIDE 0.9 % IV SOLN: INTRAVENOUS | Status: AC

## 2023-08-20 MED ORDER — SERTRALINE HCL 50 MG PO TABS
100.0000 mg | ORAL_TABLET | Freq: Every day | ORAL | Status: DC
  Administered 2023-08-21 – 2023-08-24 (×4): 100 mg via ORAL
  Filled 2023-08-20 (×4): qty 2

## 2023-08-20 MED ORDER — GUAIFENESIN ER 600 MG PO TB12: 600.0000 mg | ORAL_TABLET | Freq: Two times a day (BID) | ORAL | Status: DC | PRN

## 2023-08-20 MED ORDER — ACETAMINOPHEN 325 MG PO TABS: 650.0000 mg | ORAL_TABLET | Freq: Four times a day (QID) | ORAL | Status: DC | PRN

## 2023-08-20 MED ORDER — SODIUM CHLORIDE 0.9 % IV SOLN
500.0000 mg | INTRAVENOUS | Status: DC
  Administered 2023-08-20 – 2023-08-21 (×2): 500 mg via INTRAVENOUS
  Filled 2023-08-20 (×2): qty 5

## 2023-08-20 MED ORDER — ONDANSETRON HCL 4 MG/2ML IJ SOLN: 4.0000 mg | Freq: Four times a day (QID) | INTRAMUSCULAR | Status: DC | PRN

## 2023-08-20 NOTE — Assessment & Plan Note (Signed)
Suspect secondary to community-acquired pneumonia complicated by dehydration in setting of poor p.o. intake Continue azithromycin 500 mg daily IV and ceftriaxone 2 g IV daily IV, to complete 5-day course Incentive spirometry, flutter valve every 2 hours while awake for 30 days ordered

## 2023-08-20 NOTE — Assessment & Plan Note (Signed)
Famotidine 20 mg p.o. twice daily, pantoprazole 40 mg p.o. twice daily

## 2023-08-20 NOTE — Assessment & Plan Note (Signed)
-  Atorvastatin 20 mg daily resumed

## 2023-08-20 NOTE — ED Notes (Signed)
Patient placed on a bedpan.

## 2023-08-20 NOTE — ED Notes (Signed)
Pt cleaned of urine at this time, new linen and brief placed on pt. Pt repositioned in bed and within sight of nurses station.

## 2023-08-20 NOTE — H&P (Addendum)
History and Physical   Angelica Collins AVW:[id number] DOB: [birth year] DOA: 08/20/2023  PCP: Almetta Lovely, [facility]  Patient coming from: [facility] via EMS  I have personally briefly reviewed patient's old medical records in [facility] EMR.  Chief Concern: Altered mental status, failure to thrive  HPI: Ms. Angelica Collins is a 88 year old female with history of cognitive impairment, likely dementia, failure to thrive, hyperlipidemia, who comes to the emergency department from [facility] for chief concerns of altered mental status, worsening failure to thrive, choking and coughing during meals.  Vitals in the ED showed temperature of 97.4, respiration rate of 18, heart rate of 81, blood pressure 147/117, SpO2 of 98% on room air.  Serum sodium is 137, potassium 4.8, chloride 103, bicarb 26, BUN of 22, serum creatinine 1.10, EGFR 49, nonfasting glucose 106, WBC 16.1, hemoglobin 15.4, platelets of 299.  COVID/influenza A/influenza B/RSV PCR were negative.  Magnesium level was 2.4.  CT head wo contrast: Read as no acute intracranial process.  Chronic small vessel ischemic changes.  MRI brain wo contrast: Read as no evidence of acute intracranial abnormality.  Many prior microhemorrhages, likely due to chronic hypertension.  Generalized cerebral atrophy and chronic microvascular ischemic changes.  ED treatment: Azithromycin 500 mg IV, ceftriaxone 2 g IV, sodium chloride 1 L bolus. ---------------------------- At bedside, patient was able to tell me her first name and last name.  She knows her birthday correctly.  She was not able to tell me the current calendar year and admits that she does not know.  She does not know her current location.  She does know she is here because of pneumonia.  I clarified to her that she is currently in the hospital.  I continue with review of system asking if patient had shortness of breath, chest pain.  She denies chest pain, shortness of breath, nausea,  vomiting, dysuria, hematuria, diarrhea.  I asked her again to please tell me where she is, and she states: "You told me I am in the hospital."  Social history: Patient is from [facility]. Patient denies tobacco, EtOH, recreational drug use.  ROS: Unable to fully assess this patient has acquired cognitive impairment  ED Course: Discussed with emergency medicine provider, patient requiring hospitalization for chief concerns of failure to thrive, suspect secondary to community-acquired pneumonia.  Assessment/Plan  Principal Problem:   Community acquired pneumonia Active Problems:   Cognitive impairment   Leukocytosis   Hyperlipidemia   Depression   GERD (gastroesophageal reflux disease)   Assessment and Plan:  * Community acquired pneumonia Bibasilar patchy opacities Azithromycin 500 mg IV daily, ceftriaxone 2 g IV daily to complete 5-day course Insert spirometry, flutter valve Admit to telemetry medical, inpatient  GERD (gastroesophageal reflux disease) Famotidine 20 mg p.o. twice daily, pantoprazole 40 mg p.o. twice daily  Depression Home buspirone 7.5 mg p.o. twice daily, sertraline 100 mg daily resumed  Hyperlipidemia Atorvastatin 20 mg daily resumed  Leukocytosis Suspect secondary to community-acquired pneumonia complicated by dehydration in setting of poor p.o. intake Continue azithromycin 500 mg daily IV and ceftriaxone 2 g IV daily IV, to complete 5-day course Incentive spirometry, flutter valve every 2 hours while awake for 30 days ordered  Cognitive impairment Consistent with dementia  Chart reviewed.   DVT prophylaxis: Enoxaparin Code Status: Full code Diet: N.p.o.; pending SLP Family Communication: called niece, Dorna Mai at [phone], no pick up. Someone picked up and hung up the phone without responding Disposition Plan: Pending clinical course Consults called: PT,  OT Admission status: Telemetry cardiac, inpatient  Past Medical History:   Diagnosis Date   Cognitive impairment    Dementia (HCC)    Geographic tongue    Hypercholesteremia    Hyperlipidemia    Nonrheumatic mitral (valve) prolapse    Raynaud's syndrome without gangrene    History reviewed. No pertinent surgical history.  Social History:  reports that she has never smoked. She has never used smokeless tobacco. She reports that she does not drink alcohol and does not use drugs.  No Known Allergies Family History  Problem Relation Age of Onset   Alzheimer's disease Mother    Alzheimer's disease Sister    Family history: Family history reviewed and not pertinent.  Prior to Admission medications   Medication Sig Start Date End Date Taking? Authorizing Provider  acetaminophen (TYLENOL) 325 MG tablet Take 650 mg by mouth every 4 (four) hours. 05/31/18  Yes [provider]  aspirin 81 MG chewable tablet Chew 81 mg by mouth daily.   Yes [provider]  atorvastatin (LIPITOR) 10 MG tablet Take 20 mg by mouth daily.   Yes [provider]  busPIRone (BUSPAR) 7.5 MG tablet Take 7.5 mg by mouth 2 (two) times daily. 07/04/23  Yes [provider]  calcium-vitamin D (OSCAL WITH D) 500-200 MG-UNIT tablet Take 2 tablets by mouth daily.   Yes [provider]  Diaper Rash Products ([employer] DIAPER) 1-10 % CREA Apply topically. 08/13/23  Yes [provider]  doxycycline (VIBRA-TABS) 100 MG tablet Take 100 mg by mouth 2 (two) times daily. 08/13/23  Yes [provider]  gentamicin (GARAMYCIN) 0.3 % ophthalmic solution SMARTSIG:In Eye(s) 08/12/23  Yes [provider]  MUCINEX 600 MG 12 hr tablet Take 600 mg by mouth 2 (two) times daily as needed. 08/13/23  Yes [provider]  Omeprazole 20 MG TBEC Take 1 tablet by mouth 2 (two) times daily. 08/16/23  Yes [provider]  PEPCID AC MAXIMUM STRENGTH 20 MG tablet Take 20 mg by mouth 2 (two) times daily. 08/16/23  Yes [provider]  Polyethylene  Glycol 3350 POWD 17 g daily as needed (constipation).    Yes [provider]  sertraline (ZOLOFT) 100 MG tablet Take 100 mg by mouth daily. 01/18/19  Yes [provider]  cephALEXin (KEFLEX) 250 MG capsule Take 1 capsule (250 mg total) by mouth 3 (three) times daily. Patient not taking: Reported on 08/20/2023 12/04/20   Irean Hong, MD  risperiDONE (RISPERDAL) 0.25 MG tablet Take 0.25 mg by mouth daily. Patient not taking: Reported on 08/20/2023 03/14/23   [provider]   Physical Exam: Vitals:   08/20/23 1849 08/20/23 2145 08/20/23 2150 08/20/23 2150  BP:  (!) 167/107    Pulse:   76   Resp:    20  Temp: 98.1 F (36.7 C)     TempSrc: Oral     SpO2:   100%   Weight:      Height:       Constitutional: appears frail, age-appropriate, NAD, calm Eyes: PERRL, lids and conjunctivae normal ENMT: Mucous membranes are moist. Posterior pharynx clear of any exudate or lesions. Age-appropriate dentition. Hearing appropriate Neck: normal, supple, no masses, no thyromegaly Respiratory: clear to auscultation bilaterally, no wheezing, no crackles. Normal respiratory effort. No accessory muscle use.  Cardiovascular: Regular rate and rhythm, no murmurs / rubs / gallops. No extremity edema. 2+ pedal pulses. No carotid bruits.  Abdomen: no tenderness, no masses palpated, no hepatosplenomegaly.  Bowel sounds positive.  Musculoskeletal: no clubbing / cyanosis. No joint deformity upper and lower extremities. Good ROM, no contractures, no atrophy. Normal muscle tone.  Skin: no rashes, lesions, ulcers. No induration Neurologic: Sensation intact. Strength 5/5 in all 4.  Psychiatric: Lacks judgment and insight consistent with history of cognitive impairment. Alert and oriented x self and date of birth. Normal mood.   EKG: independently reviewed, showing sinus rhythm with rate of 80, QTc 463  Chest x-ray on Admission: I personally reviewed and I agree with radiologist reading as  below.  MR BRAIN WO CONTRAST  Result Date: 08/20/2023 CLINICAL DATA:  Neuro deficit, acute, stroke suspected EXAM: MRI HEAD WITHOUT CONTRAST TECHNIQUE: Multiplanar, multiecho pulse sequences of the brain and surrounding structures were obtained without intravenous contrast. COMPARISON:  CT Head today.  MRI head January 12, 2017. FINDINGS: Brain: No acute infarction, acute hemorrhage, hydrocephalus, extra-axial collection or mass lesion. On susceptibility weighted imaging, many prior hemorrhages that are predominantly in the cerebellum, brainstem and thalami. Generalized cerebral atrophy. Patchy white matter T2/FLAIR hyperintensities, compatible with chronic microvascular ischemic change. Vascular: Major arterial flow voids are maintained at the skull base. Skull and upper cervical spine: Normal marrow signal. Sinuses/Orbits: Negative. IMPRESSION: 1. No evidence of acute intracranial abnormality. 2. Many prior microhemorrhages, likely due to chronic hypertension. 3. Generalized cerebral atrophy (ICD10-G31.9) and chronic microvascular ischemic change. Electronically Signed   By: Feliberto Harts M.D.   On: 08/20/2023 17:12   CT Head Wo Contrast  Result Date: 08/20/2023 CLINICAL DATA:  Neurologic deficit, altered level of consciousness, failure to thrive EXAM: CT HEAD WITHOUT CONTRAST TECHNIQUE: Contiguous axial images were obtained from the base of the skull through the vertex without intravenous contrast. RADIATION DOSE REDUCTION: This exam was performed according to the departmental dose-optimization program which includes automated exposure control, adjustment of the mA and/or kV according to patient size and/or use of iterative reconstruction technique. COMPARISON:  12/24/2020 FINDINGS: Brain: Chronic small vessel ischemic changes are seen throughout the periventricular white matter, bilateral basal ganglia, and cerebellum. No evidence of acute infarct or hemorrhage. Lateral ventricles and midline  structures are otherwise unremarkable. No acute extra-axial fluid collections. No mass effect. Vascular: No hyperdense vessel or unexpected calcification. Stable atherosclerosis. Skull: Normal. Negative for fracture or focal lesion. Sinuses/Orbits: No acute finding. Other: None. IMPRESSION: 1. No acute intracranial process. Chronic small vessel ischemic changes as above. Electronically Signed   By: Sharlet Salina M.D.   On: 08/20/2023 16:19   DG Chest 2 View  Result Date: 08/20/2023 CLINICAL DATA:  Altered mental status with cough and leukocytosis EXAM: CHEST - 2 VIEW COMPARISON:  Chest radiograph dated 06/27/2023 FINDINGS: Normal lung volumes. Bibasilar patchy opacities. Unchanged blunting of the left lateral costophrenic angle. No pneumothorax. The heart size and mediastinal contours are within normal limits. Old right rib fractures. IMPRESSION: Bibasilar patchy opacities, which may represent atelectasis or pneumonia. Electronically Signed   By: Agustin Cree M.D.   On: 08/20/2023 15:56    Labs on Admission: I have personally reviewed following labs  CBC: Recent Labs  Lab 08/20/23 1302  WBC 16.1*  HGB 15.4*  HCT 48.4*  MCV 89.1  PLT 299   Basic Metabolic Panel: Recent Labs  Lab 08/20/23 1302  NA 137  K 4.8  CL 103  CO2 26  GLUCOSE 106*  BUN 22  CREATININE 1.10*  CALCIUM 8.9  MG 2.4   GFR: Estimated Creatinine Clearance: 30.4 mL/min (A) (by C-G formula based on SCr of 1.1  mg/dL (H)).  Liver Function Tests: Recent Labs  Lab 08/20/23 1302  AST 27  ALT 21  ALKPHOS 78  BILITOT 0.5  PROT 7.5  ALBUMIN 3.6   Urine analysis:    Component Value Date/Time   COLORURINE YELLOW (A) 12/04/2020 0321   APPEARANCEUR CLOUDY (A) 12/04/2020 0321   LABSPEC 1.023 12/04/2020 0321   PHURINE 5.0 12/04/2020 0321   GLUCOSEU NEGATIVE 12/04/2020 0321   HGBUR NEGATIVE 12/04/2020 0321   BILIRUBINUR NEGATIVE 12/04/2020 0321   KETONESUR NEGATIVE 12/04/2020 0321   PROTEINUR 30 (A) 12/04/2020 0321    NITRITE POSITIVE (A) 12/04/2020 0321   LEUKOCYTESUR LARGE (A) 12/04/2020 0321   This document was prepared using Dragon Voice Recognition software and may include unintentional dictation errors.  Dr. Sedalia Muta [facility]  If 7PM-7AM, please contact overnight-coverage provider If 7AM-7PM, please contact day attending provider [email or web address]  08/20/2023, 11:45 PM

## 2023-08-20 NOTE — ED Provider Notes (Signed)
[facility] Provider Note    Event Date/Time   First MD Initiated Contact with Patient 08/20/23 1405     (approximate)   History   Altered Mental Status   HPI  Angelica Collins is a 88 y.o. female   Past medical history of dementia, hyperlipidemia, here from [facility] for worsening altered mental status, failure to thrive, very poor p.o. intake, fatigue, chills, steadily declining over the last several weeks.  She had a choking episode in the summertime and ever since then she has not been the same.  Over the last 3 weeks worsening as above, with very poor p.o. intake, requiring to be spoon fed, with frequent spitting up, concerns for aspiration, coughing while eating.  Also noted to have intermittent slurred speech per niece as well.  No trauma or falls, niece believes that she is largely nonambulatory, bedbound and uses wheelchair to get around.  The patient denies any abdominal pain nausea vomiting diarrhea or urinary symptoms.  He has no chest pain or shortness of breath.  Independent Historian contributed to assessment above: Her niece is at bedside to corroborate information past medical history as above  External Medical Documents Reviewed: Speech pathology note from 07/25/2023 evaluating her dysphagia, found to have Zenker's diverticulum      Physical Exam   Triage Vital Signs: ED Triage Vitals  Encounter Vitals Group     BP 08/20/23 1250 (!) 147/117     Systolic BP Percentile --      Diastolic BP Percentile --      Pulse Rate 08/20/23 1250 81     Resp 08/20/23 1250 18     Temp 08/20/23 1250 (!) 97.4 F (36.3 C)     Temp Source 08/20/23 1250 Oral     SpO2 08/20/23 1248 97 %     Weight 08/20/23 1254 128 lb 9.6 oz (58.3 kg)     Height 08/20/23 1254 5\' 3"  (1.6 m)     Head Circumference --      Peak Flow --      Pain Score 08/20/23 1252 0     Pain Loc --      Pain Education --      Exclude from Growth Chart --     Most recent vital  signs: Vitals:   08/20/23 1248 08/20/23 1250  BP:  (!) 147/117  Pulse:  81  Resp:  18  Temp:  (!) 97.4 F (36.3 C)  SpO2: 97% 98%    General: Awake, no distress.  CV:  Good peripheral perfusion.  Resp:  Normal effort.  Abd:  No distention.  Other:  Frail chronically ill-appearing woman in no acute distress.  She is able to answer my questions and denies any pain at the moment.  She is in no respiratory distress but she appears dehydrated with cracked lips and dry mucous membranes.  She has a soft nontender abdomen.  She has some coarse lung sounds at the bases bilaterally.  ED Results / Procedures / Treatments   Labs (all labs ordered are listed, but only abnormal results are displayed) Labs Reviewed  COMPREHENSIVE METABOLIC PANEL - Abnormal; Notable for the following components:      Result Value   Glucose, Bld 106 (*)    Creatinine, Ser 1.10 (*)    GFR, Estimated 49 (*)    All other components within normal limits  CBC - Abnormal; Notable for the following components:   WBC 16.1 (*)    RBC  5.43 (*)    Hemoglobin 15.4 (*)    HCT 48.4 (*)    All other components within normal limits  RESP PANEL BY RT-PCR (RSV, FLU A&B, COVID)  [id number]  URINALYSIS, W/ REFLEX TO CULTURE (INFECTION SUSPECTED)  MAGNESIUM  TROPONIN I (HIGH SENSITIVITY)     I ordered and reviewed the above labs they are notable for elevated white blood cell count at 16.  EKG  ED ECG REPORT I, Pilar Jarvis, the attending physician, personally viewed and interpreted this ECG.   Date: 08/20/2023  EKG Time: 1302  Rate: 80  Rhythm: nsr  Axis: nl  Intervals:lafb  ST&T Change: no stemi    RADIOLOGY I independently reviewed and interpreted chest x-ray and see no obvious focal opacities or pneumothorax I also reviewed radiologist's formal read.   PROCEDURES:  Critical Care performed: No  Procedures   MEDICATIONS ORDERED IN ED: Medications  sodium chloride 0.9 % bolus 1,000 mL (has no  administration in time range)    IMPRESSION / MDM / ASSESSMENT AND PLAN / ED COURSE  I reviewed the triage vital signs and the nursing notes.                                Patient's presentation is most consistent with acute presentation with potential threat to life or bodily function.  Differential diagnosis includes, but is not limited to, dehydration, electrolyte derangement, urinary tract infection, aspiration pneumonia, viral URI, deconditioning, failure to thrive, stroke   The patient is on the cardiac monitor to evaluate for evidence of arrhythmia and/or significant heart rate changes.  MDM:    This is a patient with failure to thrive, poor p.o. intake, dysphagia and concern for aspiration given coughing, appears dehydrated.  Will get basic labs to check electrolyte derangements, kidney function, in the setting of very poor p.o. intake.  Will get a chest x-ray to check for evidence of aspiration pneumonia.  Get urinalysis to check for urinary tract infection.  She has a history of dysphagia and a choking incident in the summer, has seen speech and diagnosed with Zenker's diverticulum which I think is that the crux of her dysphagia and concerns for frequent aspiration.  However she has noted to have intermittent slurred speech as well as this swallowing dysfunction with a concern from her niece and facility for stroke.  I do not see any other acute neurologic deficits on my exam with good motor or sensory function no facial asymmetry though I do note some slurred speech.  Will get a CT of the head and MRI of the brain.  -- Her living facility is contemplating coordinating a transfer of care to a facility with higher resources to care for her increasing needs.  Her disposition will depend on workup as above, if found to have infection or other acute medical problem will treat accordingly; otherwise given the subacute nature of her symptoms and stability here in the emergency  department she can be discharged if negative evaluation for ongoing care coordination at her facility      FINAL CLINICAL IMPRESSION(S) / ED DIAGNOSES   Final diagnoses:  Altered mental status, unspecified altered mental status type  Dehydration  Dysphagia, unspecified type  Slurred speech     Rx / DC Orders   ED Discharge Orders     None        Note:  This document was prepared using Dragon voice recognition  software and may include unintentional dictation errors.    Pilar Jarvis, MD 08/20/23 [phone]

## 2023-08-20 NOTE — Hospital Course (Addendum)
Ms. Angelica Collins is a 88 year old female with history of cognitive impairment, likely dementia, failure to thrive, hyperlipidemia, who comes to the emergency department from [facility] for chief concerns of altered mental status, worsening failure to thrive, choking and coughing during meals.  Vitals in the ED showed temperature of 97.4, respiration rate of 18, heart rate of 81, blood pressure 147/117, SpO2 of 98% on room air.  Serum sodium is 137, potassium 4.8, chloride 103, bicarb 26, BUN of 22, serum creatinine 1.10, EGFR 49, nonfasting glucose 106, WBC 16.1, hemoglobin 15.4, platelets of 299.  COVID/influenza A/influenza B/RSV PCR were negative.  Magnesium level was 2.4.  CT head wo contrast: Read as no acute intracranial process.  Chronic small vessel ischemic changes.  MRI brain wo contrast: Read as no evidence of acute intracranial abnormality.  Many prior microhemorrhages, likely due to chronic hypertension.  Generalized cerebral atrophy and chronic microvascular ischemic changes.  ED treatment: Azithromycin 500 mg IV, ceftriaxone 2 g IV, sodium chloride 1 L bolus.

## 2023-08-20 NOTE — ED Provider Notes (Signed)
Care of this patient assumed from prior physician at 1500 pending completion of workup, reevaluation, and disposition. Please see prior physician note for further details.  Briefly, this is an 88 year old female with history of dementia presenting with failure to thrive over the past several weeks.  Currently resides at [facility].  Family reports some dysphagia and intermittent dysarthria.  No other focal deficits noted on initial exam. Labs obtained notable for leukocytosis at 16.1, CMP without severe derangements.  Urine, CT head, chest x-Zola Runion, and MRI brain pending at time of disposition.  Family reports largely subacute complaints.  If testing is without emergent findings, plan for possible discharge with ongoing care coordination at her facility.  CT head without acute bleed Chest x-Brownie Gockel with patchy opacities, possibly reflective of pneumonia MRI brain without acute findings Urine delayed due to patient incontinence prior to attempts to in and out cath   Case reviewed with her niece and POA, Ardeen Jourdain over the phone. She is concerned about patient's more recent decline in mental status in the setting of recent infection.  Patient does have a leukocytosis here at 16.1, but does not currently meet sepsis criteria.  Do think admission is reasonable for further management.  Will reach out to hospitalist team.  Reviewed case with Dr. Sedalia Muta.  She will evaluate the patient for anticipated admission.   Trinna Post, MD 08/20/23 [phone]

## 2023-08-20 NOTE — Assessment & Plan Note (Signed)
Consistent with dementia

## 2023-08-20 NOTE — ED Notes (Signed)
Pericare provided on patient, with this RN ans EDT [place].

## 2023-08-20 NOTE — ED Triage Notes (Signed)
Pt presents to the ED via [facility] from [facility]. Per facility patient is not at her baseline. Facility states that she wouldn't answer questions for them and explained pt to be failure to thrive. Per EMS pt was able to answer questions appropriately. Pt does have a hx of cognitive impairment. Pt's niece states that all this started about three weeks ago when patient was here for a choking episode. Niece states that patient is continuing to get choked and cough during meals and that the facility put her on a soft diet and that they are having to feed her. Niece states that patient has steadily declined over the last 3 weeks and the facility is questioning weather or not she is still appropriate for the level of care that they offer. Pt is A&Ox2.

## 2023-08-20 NOTE — Assessment & Plan Note (Addendum)
Bibasilar patchy opacities Azithromycin 500 mg IV daily, ceftriaxone 2 g IV daily to complete 5-day course Insert spirometry, flutter valve Admit to telemetry medical, inpatient

## 2023-08-20 NOTE — Assessment & Plan Note (Signed)
Home buspirone 7.5 mg p.o. twice daily, sertraline 100 mg daily resumed

## 2023-08-21 DIAGNOSIS — E860 Dehydration: Secondary | ICD-10-CM | POA: Diagnosis present

## 2023-08-21 DIAGNOSIS — R4182 Altered mental status, unspecified: Secondary | ICD-10-CM | POA: Diagnosis not present

## 2023-08-21 DIAGNOSIS — R1310 Dysphagia, unspecified: Secondary | ICD-10-CM

## 2023-08-21 LAB — URINALYSIS, W/ REFLEX TO CULTURE (INFECTION SUSPECTED)
Bacteria, UA: NONE SEEN
Bilirubin Urine: NEGATIVE
Glucose, UA: NEGATIVE mg/dL
Hgb urine dipstick: NEGATIVE
Ketones, ur: NEGATIVE mg/dL
Leukocytes,Ua: NEGATIVE
Nitrite: NEGATIVE
Protein, ur: NEGATIVE mg/dL
Specific Gravity, Urine: 1.008 (ref 1.005–1.030)
Squamous Epithelial / HPF: 0 /HPF (ref 0–5)
WBC, UA: 0 WBC/hpf (ref 0–5)
pH: 7 (ref 5.0–8.0)

## 2023-08-21 LAB — CBC
HCT: 39.9 % (ref 36.0–46.0)
Hemoglobin: 12.8 g/dL (ref 12.0–15.0)
MCH: 28.3 pg (ref 26.0–34.0)
MCHC: 32.1 g/dL (ref 30.0–36.0)
MCV: 88.3 fL (ref 80.0–100.0)
Platelets: 210 10*3/uL (ref 150–400)
RBC: 4.52 MIL/uL (ref 3.87–5.11)
RDW: 13.8 % (ref 11.5–15.5)
WBC: 11.2 10*3/uL — ABNORMAL HIGH (ref 4.0–10.5)
nRBC: 0 % (ref 0.0–0.2)

## 2023-08-21 LAB — BASIC METABOLIC PANEL
Anion gap: 8 (ref 5–15)
BUN: 11 mg/dL (ref 8–23)
CO2: 24 mmol/L (ref 22–32)
Calcium: 7.9 mg/dL — ABNORMAL LOW (ref 8.9–10.3)
Chloride: 109 mmol/L (ref 98–111)
Creatinine, Ser: 0.82 mg/dL (ref 0.44–1.00)
GFR, Estimated: >60 mL/min (ref >60)
Glucose, Bld: 87 mg/dL (ref 70–99)
Potassium: 3.9 mmol/L (ref 3.5–5.1)
Sodium: 141 mmol/L (ref 135–145)

## 2023-08-21 MED ORDER — ASPIRIN 81 MG PO TBEC
81.0000 mg | DELAYED_RELEASE_TABLET | Freq: Every day | ORAL | Status: DC
  Administered 2023-08-22 – 2023-08-24 (×3): 81 mg via ORAL
  Filled 2023-08-21 (×3): qty 1

## 2023-08-21 MED ORDER — POLYETHYLENE GLYCOL 3350 17 G PO PACK: 17.0000 g | PACK | Freq: Every day | ORAL | Status: DC | PRN

## 2023-08-21 MED ORDER — ASPIRIN 81 MG PO CHEW
81.0000 mg | CHEWABLE_TABLET | Freq: Every day | ORAL | Status: DC
  Administered 2023-08-21: 81 mg via ORAL
  Filled 2023-08-21: qty 1

## 2023-08-21 MED ORDER — ASPIRIN 81 MG PO TBEC
81.0000 mg | DELAYED_RELEASE_TABLET | Freq: Every day | ORAL | Status: DC
  Filled 2023-08-21: qty 1

## 2023-08-21 MED ORDER — ORAL CARE MOUTH RINSE: 15.0000 mL | OROMUCOSAL | Status: DC | PRN

## 2023-08-21 MED ORDER — IRBESARTAN 150 MG PO TABS
75.0000 mg | ORAL_TABLET | Freq: Every day | ORAL | Status: DC
  Administered 2023-08-21 – 2023-08-22 (×2): 75 mg via ORAL
  Filled 2023-08-21 (×2): qty 1

## 2023-08-21 NOTE — ED Notes (Signed)
Pt bed linen, blankets, brief, and under pad was changed. Pt tolerated it well.

## 2023-08-21 NOTE — Evaluation (Cosign Needed)
Physical Therapy Evaluation Patient Details Name: Angelica Collins MRN: [id number] DOB: [birth year] Today's Date: 08/21/2023  History of Present Illness  88 year old female with history of cognitive impairment, failure to thrive, HLD, mitral valve prolapse, Raynaud's, ?dementia, who comes to the emergency department from [facility] for chief concerns of altered mental status, worsening failure to thrive, choking and coughing during meals.  Clinical Impression   Pt presents laying in bed, A&Ox1 only oriented to self and no complaints of pain. She currently lives at [facility] and receives assistance for mobility and ADLs. Pt poor historian, history of PLOF and living set up obtained from chart.   Pt required maximal encouragement and multimodal cueing for participation in therapy. She performed supine>sit MaxA, sit>supine maxAx2, and lateral scooted 1x toward HOB with modA. Pt exhibited poor initial sitting balance with prominent posterior lean, requiring maxA to achieve/maintain upright but progressed to CGA. Today's session overall limited to pt's BP response to activity (BP in supine 166/73, reassessed in supine 168/72, sitting 174/79) and unable to continue with further mobility.  She would benefit from continued skilled therapy to maximize functional abilities.       If plan is discharge home, recommend the following: Two people to help with walking and/or transfers;A lot of help with bathing/dressing/bathroom;Assistance with feeding;Assistance with cooking/housework;Direct supervision/assist for financial management;Supervision due to cognitive status;Assist for transportation;Help with stairs or ramp for entrance;Direct supervision/assist for medications management   Can travel by private vehicle        Equipment Recommendations None recommended by PT  Recommendations for Other Services       Functional Status Assessment Patient has had a recent decline in their functional status and  demonstrates the ability to make significant improvements in function in a reasonable and predictable amount of time.     Precautions / Restrictions Precautions Precautions: Fall Restrictions Weight Bearing Restrictions: No      Mobility  Bed Mobility Overal bed mobility: Needs Assistance Bed Mobility: Supine to Sit, Sit to Supine     Supine to sit: Max assist Sit to supine: Max assist, +2 for physical assistance   General bed mobility comments: Prominent posterior lean in initial sitting, requiring maxA to achieve upright    Transfers Overall transfer level: Needs assistance                 General transfer comment: modA for lateral scooting towards HOB x1    Ambulation/Gait                  Stairs            Wheelchair Mobility     Tilt Bed    Modified Rankin (Stroke Patients Only)       Balance Overall balance assessment: Needs assistance Sitting-balance support: Bilateral upper extremity supported, Feet supported Sitting balance-Leahy Scale: Poor   Postural control: Posterior lean                                   Pertinent Vitals/Pain Pain Assessment Pain Assessment: Faces Faces Pain Scale: No hurt    Home Living Family/patient expects to be discharged to:: Assisted living                 Home Equipment: Wheelchair - manual Additional Comments: [place]    Prior Function Prior Level of Function : Needs assist  Mobility Comments: Per chart, pt requires assist for transfers/propelling w/c ADLs Comments: Per chart, pt having difficulties with feeding/ADLs requiring assistance     Extremity/Trunk Assessment   Upper Extremity Assessment Upper Extremity Assessment: Difficult to assess due to impaired cognition;Defer to OT evaluation    Lower Extremity Assessment Lower Extremity Assessment: Difficult to assess due to impaired cognition;Generalized weakness       Communication    Communication Communication: Difficulty communicating thoughts/reduced clarity of speech  Cognition Arousal: Alert Behavior During Therapy: Agitated   Area of Impairment: Orientation, Safety/judgement                 Orientation Level: Disoriented to, Place, Time, Situation       Safety/Judgement: Decreased awareness of safety, Decreased awareness of deficits              General Comments General comments (skin integrity, edema, etc.): BP in supine 166/73, reassessed in supine 168/72, sitting 174/79    Exercises     Assessment/Plan    PT Assessment Patient needs continued PT services  PT Problem List Decreased strength;Decreased range of motion;Decreased activity tolerance;Decreased balance;Decreased mobility;Decreased cognition;Decreased knowledge of use of DME;Decreased safety awareness       PT Treatment Interventions DME instruction;Functional mobility training;Therapeutic activities;Therapeutic exercise;Balance training;Patient/family education    PT Goals (Current goals can be found in the Care Plan section)  Acute Rehab PT Goals Patient Stated Goal: return to [facility] [facility] PT Goal Formulation: With patient Time For Goal Achievement: 09/04/23 Potential to Achieve Goals: Fair    Frequency Min 1X/week     Co-evaluation               AM-PAC PT "6 Clicks" Mobility  Outcome Measure Help needed turning from your back to your side while in a flat bed without using bedrails?: A Lot Help needed moving from lying on your back to sitting on the side of a flat bed without using bedrails?: A Lot Help needed moving to and from a bed to a chair (including a wheelchair)?: A Lot Help needed standing up from a chair using your arms (e.g., wheelchair or bedside chair)?: A Lot Help needed to walk in hospital room?: Total Help needed climbing 3-5 steps with a railing? : Total 6 Click Score: 10    End of Session   Activity Tolerance: Treatment limited secondary  to agitation;Other (comment) (Limited by BP response to activity) Patient left: in bed;with call bell/phone within reach;with bed alarm set   PT Visit Diagnosis: Other abnormalities of gait and mobility (R26.89);Muscle weakness (generalized) (M62.81)    Time: 0454-0981 PT Time Calculation (min) (ACUTE ONLY): 26 min   Charges:    1 visit 1 Low complexity eval 23-37 minutes therapeutic activity            Nai Dasch, PT, SPT 4:11 PM,08/21/23

## 2023-08-21 NOTE — Progress Notes (Signed)
Order received to place pure wick

## 2023-08-21 NOTE — Evaluation (Signed)
Clinical/Bedside Swallow Evaluation Patient Details  Name: Angelica Collins MRN: [id number] Date of Birth: 1935-01-21  Today's Date: 08/21/2023 Time: SLP Start Time (ACUTE ONLY): 1150 SLP Stop Time (ACUTE ONLY): 1250 SLP Time Calculation (min) (ACUTE ONLY): 60 min  Past Medical History:  Past Medical History:  Diagnosis Date   Cognitive impairment    Dementia (HCC)    Geographic tongue    Hypercholesteremia    Hyperlipidemia    Nonrheumatic mitral (valve) prolapse    Raynaud's syndrome without gangrene    Past Surgical History: History reviewed. No pertinent surgical history. HPI:  Pt is a 88 year old female with history of Cognitive impairment, likely Dementia, failure to thrive, hyperlipidemia, Hiatal Hernia, Raynaud's dis. who comes to the emergency department from [facility] for chief concerns of altered mental status, worsening failure to thrive, choking and coughing during meals.  Pt requires Much assistance w/ ADLs.   Pt had a MBSS 07/25/2023: "Patient presents with grossly functional oropharyngeal phase swallowing function for age; MOD Esophageal phase Dysmotility/Dysphagia in setting of apparent posterior Cervical Esophageal ZENKER'S DIVERTICULUM.  ANY Esophageal phase deficits can impact the oropharyngeal phase of swallowing and increase risk for aspiration of REFLUX material from the Esophagus. NO laryngeal penetration nor aspiration occurred during this study.".   CXR: Bibasilar patchy opacities, which may represent atelectasis or pneumonia.   MRI: no acute changes.    Assessment / Plan / Recommendation  Clinical Impression   Pt seen for BSE today. Pt awake, watching tv. Pt attempting to feed self some of her previous meal w/ min difficulty holding utensil/cup - baseline per pt report. Pt has Baseline mild Cognitive decline/Dementia per chart notes; distracted at times but followed through w/ tasks given verbal/visual cues.  On RA; afebrile. WBC 11.2 trending down.  Pt had a MBSS  07/25/2023: "Patient presents with grossly functional oropharyngeal phase swallowing function for age; MOD Esophageal phase Dysmotility/Dysphagia in setting of apparent posterior Cervical Esophageal ZENKER'S DIVERTICULUM.  ANY Esophageal phase deficits can impact the oropharyngeal phase of swallowing and increase risk for aspiration of REFLUX material from the Esophagus. NO laryngeal penetration nor aspiration occurred during this study.".  Pt appears to present w/ grossly functional oropharyngeal phase swallowing w/ min slower oral phase/OM movements intermittently but overall functional for bolus management and swallowing. Pt consumed po trials w/ No immediate, overt clinical s/s of aspiration during po trials. Pt appears at reduced risk for aspiration following general aspiration precautions w/ a slightly modified diet consistency(foods).  However, pt does have challenging factors that could impact her oropharyngeal swallowing to include a CHRONIC Zenker's Diverticulum and Esophageal phase Dysmotility, Hiatal Hernia, worsening FTT per chart notes, fatigue/weakness, need for assistance w/ feeding at meals, and advanced age. These factors can increase risk for aspiration, dysphagia as well as decreased oral intake overall.   During po trials, pt consumed all consistencies w/ no immediate, overt coughing, decline in vocal quality, or change in respiratory presentation during/post trials. O2 sats when checked were 98%. Oral phase appeared grossly [facility] w/ bolus management, mastication, and control of bolus propulsion for A-P transfer for swallowing. Min slower bolus management noted intermittently. Oral clearing achieved w/ all trial consistencies -- moistened, soft foods given.  OM Exam appeared grossly [facility] w/ no unilateral weakness noted. Speech Clear, slower. Pt fed self w/ setup and MOD support.  OF NOTE: intermittent phlegmy vocal quality noted w/ solid trials w/ throat clearing following - suspect potential  impact from the Esophageal phase Dysmotility impacting the pharyngeal phase.  Rest Breaks given b/t trials for pt to Dry swallow to clear the residue. Alternated foods/liquids also.  Recommend a more mech soft consistency diet w/ Minced meats, moistened foods; Thin liquids -- carefully monitor straw use, and pt should help to Hold Cup when drinking. Recommend general aspiration precautions, reduce distractions during meals. REFLUX precautions -- DRY SWALLOW and alternate foods/liquids to aid Esophageal clearing and/or clearing of any pharyngoesophageal residue from boluses during Retrograde activity from the Zenker's Diverticulum. Supervision at meals for follow through w/ precautions d/t Cognitive decline. Pills CRUSHED in Puree for safer, easier swallowing.  All of the above was recommended at the [facility] on 07/25/2023.   Education given on Pills in Puree; food consistencies and prep; general aspiration precautions; REFLUX precautions to pt and NSG - posted in room/chart. MD updated on the above and the need for f/u w/ Palliative Care to discuss GOC re: her CHRONIC swallowing issues. NSG updated, agreed. Recommend Dietician f/u for support. SLP Visit Diagnosis: Dysphagia, pharyngoesophageal phase (R13.14) (CHRONIC)    Aspiration Risk  Moderate aspiration risk;Risk for inadequate nutrition/hydration    Diet Recommendation   Thin;Dysphagia 3 (mechanical soft) (meats minced) = a more mech soft consistency diet w/ Minced meats, moistened foods; Thin liquids -- carefully monitor straw use, and pt should help to Hold Cup when drinking. Recommend general aspiration precautions, reduce distractions during meals. REFLUX precautions -- DRY SWALLOW and alternate foods/liquids to aid Esophageal clearing and/or clearing of any pharyngoesophageal residue from boluses during Retrograde activity from the Zenker's Diverticulum. Supervision at meals for follow through w/ precautions d/t Cognitive decline.   Medication  Administration: Crushed with puree    Other  Recommendations Recommended Consults: Consider ENT evaluation (Palliative Care/Hospice for GOC moving forward - Zenker's Div.) Oral Care Recommendations: Oral care BID;Oral care before and after PO;Staff/trained caregiver to provide oral care    Recommendations for follow up therapy are one component of a multi-disciplinary discharge planning process, led by the attending physician.  Recommendations may be updated based on patient status, additional functional criteria and insurance authorization.  Follow up Recommendations Follow physician's recommendations for discharge plan and follow up therapies      Assistance Recommended at Discharge  FULL  Functional Status Assessment Patient has had a recent decline in their functional status and/or demonstrates limited ability to make significant improvements in function in a reasonable and predictable amount of time  Frequency and Duration  (n/a)   (n/a)       Prognosis Prognosis for improved oropharyngeal function: Guarded Barriers to Reach Goals: Cognitive deficits;Time post onset;Severity of deficits Barriers/Prognosis Comment: CHRONIC Zenker's Diverticulum; Cognitive decline      Swallow Study   General Date of Onset: 08/20/23 HPI: Pt is a 88 year old female with history of Cognitive impairment, likely Dementia, failure to thrive, hyperlipidemia, Hiatal Hernia, Raynaud's dis. who comes to the emergency department from [facility] for chief concerns of altered mental status, worsening failure to thrive, choking and coughing during meals.  Pt requires Much assistance w/ ADLs.  Pt had a MBSS 07/25/2023: "Patient presents with grossly functional oropharyngeal phase swallowing function for age; MOD Esophageal phase Dysmotility/Dysphagia in setting of apparent posterior Cervical Esophageal ZENKER'S DIVERTICULUM.  ANY Esophageal phase deficits can impact the oropharyngeal phase of swallowing and increase  risk for aspiration of REFLUX material from the Esophagus. NO laryngeal penetration nor aspiration occurred during this study.".  CXR: Bibasilar patchy opacities, which may represent atelectasis or pneumonia.   MRI: no acute changes. Type of Study:  Bedside Swallow Evaluation Previous Swallow Assessment: MBSS 07/25/23 Diet Prior to this Study: Regular;Thin liquids (Level 0) Temperature Spikes Noted: No (wbc 11.2 trending down) Respiratory Status: Room air History of Recent Intubation: No Behavior/Cognition: Alert;Cooperative;Pleasant mood;Confused;Distractible;Requires cueing Oral Cavity Assessment: Within Functional Limits Oral Care Completed by SLP: Yes Oral Cavity - Dentition: Adequate natural dentition (functional) Vision: Functional for self-feeding Self-Feeding Abilities: Able to feed self;Needs assist;Needs set up (supported) Patient Positioning: Upright in bed (supported positioning ore upright and forward) Baseline Vocal Quality: Normal Volitional Cough:  (Fair) Volitional Swallow: Able to elicit    Oral/Motor/Sensory Function Overall Oral Motor/Sensory Function: Generalized oral weakness   Ice Chips Ice chips: Within functional limits Presentation: Spoon (fed; 3 trials) Other Comments: min slow OM movements   Thin Liquid Thin Liquid: Within functional limits (grossly) Presentation: Straw;Self Fed (supported; 10+ trials) Other Comments: min slow OM movements    Nectar Thick Nectar Thick Liquid: Not tested   Honey Thick Honey Thick Liquid: Not tested   Puree Puree: Within functional limits (grossly) Presentation: Spoon (supported; 10 trials) Other Comments: min slow OM movements   Solid     Solid: Impaired Presentation: Spoon (supported; 8 trials) Oral Phase Impairments: Impaired mastication (slower) Oral Phase Functional Implications: Prolonged oral transit;Impaired mastication (slower) Pharyngeal Phase Impairments: Throat Clearing - Delayed (x2 b/t trials) Other  Comments: phlegmy        Jerilynn Som, MS, CCC-SLP Speech Language Pathologist Rehab Services; [facility] [place] [phone] (ascom) Careli Luzader 08/21/2023,3:54 PM

## 2023-08-21 NOTE — Progress Notes (Signed)
PROGRESS NOTE  Angelica Collins    DOB: 07/20/1935, 88 y.o.  ZOX:[id number]    Code Status: Full Code   DOA: 08/20/2023   LOS: 1   Brief hospital course  Angelica Collins is a 88 y.o. female with a PMH significant for cognitive impairment, likely dementia, failure to thrive, hyperlipidemia.  They presented from [facility] to the ED on 08/20/2023 with AMS x 1 days. They described increased difficulty with eating and "failure to thrive".   In the ED, it was found that they had temperature of 97.4, respiration rate of 18, heart rate of 81, blood pressure 147/117, SpO2 of 98% on room air.  Significant findings included Serum sodium is 137, potassium 4.8, Mg++ 2.4, chloride 103, bicarb 26, BUN of 22, serum creatinine 1.10, EGFR 49, nonfasting glucose 106, WBC 16.1, hemoglobin 15.4, platelets of 299.  COVID/influenza A/influenza B/RSV PCR were negative.  CT head wo contrast: Read as no acute intracranial process.  Chronic small vessel ischemic changes. MRI brain wo contrast: Read as no evidence of acute intracranial abnormality.  Many prior microhemorrhages, likely due to chronic hypertension.  Generalized cerebral atrophy and chronic microvascular ischemic changes..  They were initially treated with Azithromycin 500 mg IV, ceftriaxone 2 g IV, sodium chloride 1 L bolus.  Patient was admitted to medicine service for further workup and management of pneumonia as outlined in detail below.  08/21/23 -stable on room air  Assessment & Plan  Principal Problem:   Community acquired pneumonia Active Problems:   Cognitive impairment   Leukocytosis   Hyperlipidemia   Depression   GERD (gastroesophageal reflux disease)  CAP- stable ORA. Bibasilar patchy opacities on chest xray Azithromycin 500 mg IV daily, ceftriaxone 2 g IV daily- transition to PO tomorrow if continues to be stable ORA Insert spirometry, flutter valve  Cognitive impairment- appears to be worsening. May need increased level of care at dc.  Rule out acute metabolic causes. Head imaging negative acute findings. Consistent with vascular dementia.  - SLP evaluation - PT/OT - TOC engaged for placement - monitor for delirium   Elevated blood pressure- no treatment at home. Systolics elevated up to 200.  - slowly and gradually decrease pressures - irbesartan added    GERD (gastroesophageal reflux disease) Famotidine 20 mg p.o. twice daily, pantoprazole 40 mg p.o. twice daily   Depression Home buspirone 7.5 mg p.o. twice daily, sertraline 100 mg daily resumed   Hyperlipidemia Atorvastatin 20 mg daily resumed   Body mass index is 22.78 kg/m.  VTE ppx: enoxaparin (LOVENOX) injection 40 mg Start: 08/20/23 2200 Place TED hose Start: 08/20/23 1815   Diet:     Diet   Diet NPO time specified Except for: Sips with Meds, Ice Chips   Consultants: None   Subjective 08/21/23    Pt reports no complaints other than she wants to go home. Denies respiratory symptoms, pain.    Objective   Vitals:   08/21/23 0000 08/21/23 0600 08/21/23 0649 08/21/23 0652  BP: (!) 174/66 (!) 161/62 (!) 161/62   Pulse: 66 62    Resp: 18   20  Temp:      TempSrc:      SpO2: 100% 100%    Weight:      Height:       No intake or output data in the 24 hours ending 08/21/23 0756 Filed Weights   08/20/23 1254  Weight: 58.3 kg     Physical Exam:  General: awake, alert, NAD HEENT: atraumatic, clear  conjunctiva, anicteric sclera, MMM, hearing grossly normal Respiratory: normal respiratory effort. CTAB Cardiovascular: quick capillary refill Nervous: A&O x2. no gross focal neurologic deficits, normal speech Extremities: moves all equally, no edema, normal tone Skin: dry, intact, normal temperature, normal color. No rashes, lesions or ulcers on exposed skin Psychiatry: normal mood, congruent affect  Labs   I have personally reviewed the following labs and imaging studies CBC    Component Value Date/Time   WBC 11.2 (H) 08/21/2023 0521    RBC 4.52 08/21/2023 0521   HGB 12.8 08/21/2023 0521   HCT 39.9 08/21/2023 0521   PLT 210 08/21/2023 0521   MCV 88.3 08/21/2023 0521   MCH 28.3 08/21/2023 0521   MCHC 32.1 08/21/2023 0521   RDW 13.8 08/21/2023 0521   LYMPHSABS 1.2 03/28/2019 0354   MONOABS 0.9 03/28/2019 0354   EOSABS 0.1 03/28/2019 0354   BASOSABS 0.0 03/28/2019 0354      Latest Ref Rng & Units 08/21/2023    5:21 AM 08/20/2023    1:02 PM 12/03/2020    6:45 PM  BMP  Glucose 70 - 99 mg/dL 87  409  811   BUN 8 - 23 mg/dL 11  22  22    Creatinine 0.44 - 1.00 mg/dL 9.14  7.82  9.56   Sodium 135 - 145 mmol/L 141  137  139   Potassium 3.5 - 5.1 mmol/L 3.9  4.8  4.6   Chloride 98 - 111 mmol/L 109  103  102   CO2 22 - 32 mmol/L 24  26  26    Calcium 8.9 - 10.3 mg/dL 7.9  8.9  9.3     MR BRAIN WO CONTRAST  Result Date: 08/20/2023 CLINICAL DATA:  Neuro deficit, acute, stroke suspected EXAM: MRI HEAD WITHOUT CONTRAST TECHNIQUE: Multiplanar, multiecho pulse sequences of the brain and surrounding structures were obtained without intravenous contrast. COMPARISON:  CT Head today.  MRI head January 12, 2017. FINDINGS: Brain: No acute infarction, acute hemorrhage, hydrocephalus, extra-axial collection or mass lesion. On susceptibility weighted imaging, many prior hemorrhages that are predominantly in the cerebellum, brainstem and thalami. Generalized cerebral atrophy. Patchy white matter T2/FLAIR hyperintensities, compatible with chronic microvascular ischemic change. Vascular: Major arterial flow voids are maintained at the skull base. Skull and upper cervical spine: Normal marrow signal. Sinuses/Orbits: Negative. IMPRESSION: 1. No evidence of acute intracranial abnormality. 2. Many prior microhemorrhages, likely due to chronic hypertension. 3. Generalized cerebral atrophy (ICD10-G31.9) and chronic microvascular ischemic change. Electronically Signed   By: Feliberto Harts M.D.   On: 08/20/2023 17:12   CT Head Wo Contrast  Result  Date: 08/20/2023 CLINICAL DATA:  Neurologic deficit, altered level of consciousness, failure to thrive EXAM: CT HEAD WITHOUT CONTRAST TECHNIQUE: Contiguous axial images were obtained from the base of the skull through the vertex without intravenous contrast. RADIATION DOSE REDUCTION: This exam was performed according to the departmental dose-optimization program which includes automated exposure control, adjustment of the mA and/or kV according to patient size and/or use of iterative reconstruction technique. COMPARISON:  12/24/2020 FINDINGS: Brain: Chronic small vessel ischemic changes are seen throughout the periventricular white matter, bilateral basal ganglia, and cerebellum. No evidence of acute infarct or hemorrhage. Lateral ventricles and midline structures are otherwise unremarkable. No acute extra-axial fluid collections. No mass effect. Vascular: No hyperdense vessel or unexpected calcification. Stable atherosclerosis. Skull: Normal. Negative for fracture or focal lesion. Sinuses/Orbits: No acute finding. Other: None. IMPRESSION: 1. No acute intracranial process. Chronic small vessel ischemic changes as above. Electronically Signed  By: Sharlet Salina M.D.   On: 08/20/2023 16:19   DG Chest 2 View  Result Date: 08/20/2023 CLINICAL DATA:  Altered mental status with cough and leukocytosis EXAM: CHEST - 2 VIEW COMPARISON:  Chest radiograph dated 06/27/2023 FINDINGS: Normal lung volumes. Bibasilar patchy opacities. Unchanged blunting of the left lateral costophrenic angle. No pneumothorax. The heart size and mediastinal contours are within normal limits. Old right rib fractures. IMPRESSION: Bibasilar patchy opacities, which may represent atelectasis or pneumonia. Electronically Signed   By: Agustin Cree M.D.   On: 08/20/2023 15:56    Disposition Plan & Communication  Patient status: Inpatient  Admitted From: ALF Planned disposition location: Skilled nursing facility Anticipated discharge date: 9/29  pending placement  Family Communication: none at bedside    Author: Leeroy Bock, DO [facility] 08/21/2023, 7:56 AM   Available by Epic secure chat 7AM-7PM. If 7PM-7AM, please contact night-coverage.  [facility] contact information found on [email or web address].

## 2023-08-21 NOTE — ED Notes (Signed)
Rn called facility per facility request. Facility concerned about pt swallowing, RN informed them that speech is going to assess pt right now, and updated them on pt care.

## 2023-08-21 NOTE — ED Notes (Signed)
RN called [name] talked to Angelica Collins. Per [name] pt has been full assist pt can stand and grab a grab bar in the bathroom. Pt is being wheeled around in her wheelchair and staff feeds pt.

## 2023-08-21 NOTE — Evaluation (Signed)
Occupational Therapy Evaluation Patient Details Name: Angelica Collins MRN: [id number] DOB: 07/02/1935 Today's Date: 08/21/2023   History of Present Illness 88 year old female with history of cognitive impairment, failure to thrive, HLD, mitral valve prolapse, Raynaud's, ?dementia, who comes to the emergency department from [facility] for chief concerns of altered mental status, worsening failure to thrive, choking and coughing during meals.   Clinical Impression   Pt was seen for OT evaluation this date. Spoke to [facility] prior to session. Per POA pt required assist for w/c-bound mobility, transfers, and ADL at baseline but unsure exactly how much assist. RN spoke with facility and per facility pt required significant assist for ADL and mobility, increasing with worsening cognition/AMS. Pt alert, oriented to self, expresses frustration about not knowing why she is here. Is able to redirect with cues/conversation. Pt presents to acute OT demonstrating impaired ADL performance and functional mobility 2/2 impaired cognition, safety awareness, strength, coordination, and balance (See OT problem list for additional functional deficits). Pt currently requires hand over hand for self feeding with preloaded spoon and grooming tasks. PRN VC to redirect to task. Prior to EOB attempt, RN notes pt has a bed available on the unit and will be transferred soon. Further ADL/mobility deferred and will assess further next session. Pt would benefit from skilled OT services to address noted impairments and functional limitations (see below for any additional details) in order to maximize safety and independence while minimizing falls risk and caregiver burden.     If plan is discharge home, recommend the following: A lot of help with walking and/or transfers;A lot of help with bathing/dressing/bathroom;Direct supervision/assist for medications management;Supervision due to cognitive status;Direct supervision/assist for financial  management;Help with stairs or ramp for entrance;Assistance with feeding;Assistance with cooking/housework;Assist for transportation    Functional Status Assessment  Patient has had a recent decline in their functional status and/or demonstrates limited ability to make significant improvements in function in a reasonable and predictable amount of time  Equipment Recommendations  Other (comment) (defer to facility)    Recommendations for Other Services       Precautions / Restrictions Precautions Precautions: Fall Restrictions Weight Bearing Restrictions: No      Mobility Bed Mobility               General bed mobility comments: MAX A +2 for boosting up in bed    Transfers                   General transfer comment: deferred - pt preparing to transfer to floor      Balance                                           ADL either performed or assessed with clinical judgement   ADL Overall ADL's : Needs assistance/impaired Eating/Feeding: Maximal assistance;Bed level Eating/Feeding Details (indicate cue type and reason): MAX A to hand over hand with PRN VC for initiation and sequencing Grooming: Bed level;Maximal assistance Grooming Details (indicate cue type and reason): hand over hand       Lower Body Bathing Details (indicate cue type and reason): anticipate MAX A   Upper Body Dressing Details (indicate cue type and reason): anticipate MOD A   Lower Body Dressing Details (indicate cue type and reason): anticipate MAX A  Vision         Perception         Praxis         Pertinent Vitals/Pain Pain Assessment Pain Assessment: Faces Faces Pain Scale: No hurt Pain Intervention(s): Monitored during session     Extremity/Trunk Assessment Upper Extremity Assessment Upper Extremity Assessment: Difficult to assess due to impaired cognition;Generalized weakness (impaired FMC/GMC)   Lower Extremity  Assessment Lower Extremity Assessment: Difficult to assess due to impaired cognition;Generalized weakness       Communication     Cognition Arousal: Alert Behavior During Therapy: Agitated Overall Cognitive Status: History of cognitive impairments - at baseline                                 General Comments: pt oriented to self only     General Comments       Exercises     Shoulder Instructions      Home Living Family/patient expects to be discharged to:: Assisted living                                 Additional Comments: [facility] memory unit?      Prior Functioning/Environment Prior Level of Function : Needs assist             Mobility Comments: Per [facility] and POA pt required assist for transfers and propelling w/c ADLs Comments: Per [facility] and POA pt requried assist for all ADL including self feeding        OT Problem List: Decreased coordination;Decreased strength;Decreased cognition;Decreased safety awareness;Impaired UE functional use;Impaired balance (sitting and/or standing)      OT Treatment/Interventions: Self-care/ADL training;Therapeutic exercise;Therapeutic activities;Cognitive remediation/compensation;DME and/or AE instruction;Patient/family education;Balance training    OT Goals(Current goals can be found in the care plan section) Acute Rehab OT Goals Patient Stated Goal: get out of this place OT Goal Formulation: With patient Time For Goal Achievement: 09/04/23 Potential to Achieve Goals: Fair ADL Goals Pt Will Perform Eating: sitting;with caregiver independent in assisting (VC PRN, PRN hand over hand) Pt Will Perform Grooming: sitting;with caregiver independent in assisting  OT Frequency: Min 1X/week    Co-evaluation              AM-PAC OT "6 Clicks" Daily Activity     Outcome Measure Help from another person eating meals?: A Lot Help from another person taking care of personal  grooming?: A Lot Help from another person toileting, which includes using toliet, bedpan, or urinal?: Total Help from another person bathing (including washing, rinsing, drying)?: A Lot Help from another person to put on and taking off regular upper body clothing?: A Lot Help from another person to put on and taking off regular lower body clothing?: A Lot 6 Click Score: 11   End of Session Nurse Communication: Mobility status  Activity Tolerance: Treatment limited secondary to agitation Patient left: in bed;with call bell/phone within reach;with bed alarm set  OT Visit Diagnosis: Other abnormalities of gait and mobility (R26.89)                Time: 1025-1039 OT Time Calculation (min): 14 min Charges:  OT General Charges $OT Visit: 1 Visit OT Evaluation $OT Eval Low Complexity: 1 Low  Arman Filter., MPH, MS, OTR/L ascom [phone] 08/21/23, 1:18 PM

## 2023-08-21 NOTE — ED Notes (Signed)
Pt eating breakfast, OT helping pt eat at this time. Pt give medications crushed in apple sauce. Pt eating grits and scrambled eggs.

## 2023-08-21 NOTE — Progress Notes (Signed)
BP 204/88 manual. Dr Dareen Piano notified. New order placed for irbesartan

## 2023-08-22 DIAGNOSIS — R4189 Other symptoms and signs involving cognitive functions and awareness: Secondary | ICD-10-CM | POA: Diagnosis not present

## 2023-08-22 DIAGNOSIS — J189 Pneumonia, unspecified organism: Secondary | ICD-10-CM | POA: Diagnosis not present

## 2023-08-22 DIAGNOSIS — R531 Weakness: Secondary | ICD-10-CM

## 2023-08-22 MED ORDER — IRBESARTAN 150 MG PO TABS
150.0000 mg | ORAL_TABLET | Freq: Every day | ORAL | Status: DC
  Administered 2023-08-23 – 2023-08-24 (×2): 150 mg via ORAL
  Filled 2023-08-22 (×2): qty 1

## 2023-08-22 MED ORDER — AZITHROMYCIN 250 MG PO TABS
500.0000 mg | ORAL_TABLET | Freq: Every day | ORAL | Status: AC
  Administered 2023-08-22 – 2023-08-24 (×3): 500 mg via ORAL
  Filled 2023-08-22 (×3): qty 2

## 2023-08-22 MED ORDER — CEFDINIR 300 MG PO CAPS
300.0000 mg | ORAL_CAPSULE | Freq: Two times a day (BID) | ORAL | Status: DC
  Administered 2023-08-22 – 2023-08-24 (×5): 300 mg via ORAL
  Filled 2023-08-22 (×5): qty 1

## 2023-08-22 NOTE — Progress Notes (Signed)
PROGRESS NOTE  Angelica Collins    DOB: 11-Jan-1935, 88 y.o.  WJX:[id number]    Code Status: Do not attempt resuscitation (DNR) PRE-ARREST INTERVENTIONS DESIRED   DOA: 08/20/2023   LOS: 2   Brief hospital course  Angelica Collins is a 88 y.o. female with a PMH significant for cognitive impairment, likely dementia, failure to thrive, hyperlipidemia.  They presented from [facility] to the ED on 08/20/2023 with AMS x 1 days. They described increased difficulty with eating and "failure to thrive".   In the ED, it was found that they had temperature of 97.4, respiration rate of 18, heart rate of 81, blood pressure 147/117, SpO2 of 98% on room air.  Significant findings included Serum sodium is 137, potassium 4.8, Mg++ 2.4, chloride 103, bicarb 26, BUN of 22, serum creatinine 1.10, EGFR 49, nonfasting glucose 106, WBC 16.1, hemoglobin 15.4, platelets of 299.  COVID/influenza A/influenza B/RSV PCR were negative.  CT head wo contrast: Read as no acute intracranial process.  Chronic small vessel ischemic changes. MRI brain wo contrast: Read as no evidence of acute intracranial abnormality.  Many prior microhemorrhages, likely due to chronic hypertension.  Generalized cerebral atrophy and chronic microvascular ischemic changes..  They were initially treated with Azithromycin 500 mg IV, ceftriaxone 2 g IV, sodium chloride 1 L bolus.  Patient was admitted to medicine service for further workup and management of pneumonia as outlined in detail below.  08/22/23 -stable on room air  Assessment & Plan  Principal Problem:   Community acquired pneumonia Active Problems:   Cognitive impairment   Leukocytosis   Hyperlipidemia   Depression   GERD (gastroesophageal reflux disease)   Altered mental status   Dehydration   Dysphagia  CAP- stable on room air. Bibasilar patchy opacities on chest xray. Treated with empiric IV Rocephin & Zithromax  Transition to PO Zithromax and Omnicef to complete course Insert  spirometry, flutter valve  Cognitive impairment- appears to be worsening. From ALF, they report unable to accept her back, needs SNF/rehab, currently requiring more assistance than feasible at ALF.  Rule out acute metabolic causes. Head imaging negative acute findings.  Consistent with vascular dementia.  - SLP evaluation - PT/OT - TOC engaged for placement - monitor for delirium   Elevated blood pressure- no treatment at home. SBP elevated up to 200, improved to SBP 150's to 170's on low dose irbesartan started this admission.  - slowly and gradually decrease pressures - irbesartan added - increase dose 75 >> 150 mg daily   GERD (gastroesophageal reflux disease) Famotidine 20 mg p.o. twice daily, pantoprazole 40 mg p.o. twice daily   Depression Home buspirone 7.5 mg p.o. twice daily, sertraline 100 mg daily resumed   Hyperlipidemia Atorvastatin 20 mg daily resumed   Body mass index is 22.78 kg/m.  VTE ppx: enoxaparin (LOVENOX) injection 40 mg Start: 08/20/23 2200 Place TED hose Start: 08/20/23 1815   Diet:     Diet   DIET DYS 3 Room service appropriate? Yes with Assist; Fluid consistency: Thin   Consultants: None   Subjective 08/22/23    Pt seen this AM, pleasantly confused.  Reports she feels okay.  Denies any complaints.     Objective   Vitals:   08/21/23 2158 08/22/23 0154 08/22/23 0540 08/22/23 1009  BP: (!) 170/68 (!) 158/81 (!) 174/69 (!) 167/74  Pulse: 64 63 62 (!) 57  Resp: 20 16 16    Temp: 97.8 F (36.6 C) 97.8 F (36.6 C) 98.2 F (36.8 C) 98.2  F (36.8 C)  TempSrc: Oral Oral Oral Oral  SpO2: 96% 96% 100% 91%  Weight:      Height:        Intake/Output Summary (Last 24 hours) at 08/22/2023 1543 Last data filed at 08/22/2023 0500 Gross per 24 hour  Intake 915.14 ml  Output 1400 ml  Net -484.86 ml   Filed Weights   08/20/23 1254  Weight: 58.3 kg     Physical Exam:   General exam: awake, alert, no acute distress, pleasantly  confused HEENT: moist mucus membranes, hearing grossly normal  Respiratory system: CTAB, no wheezes, rales or rhonchi, normal respiratory effort. Cardiovascular system: normal S1/S2, RRR, no pedal edema.   Gastrointestinal system: soft, NT, ND, no HSM felt, +bowel sounds. Central nervous system: A&O x 2+. no gross focal neurologic deficits, normal speech Extremities: moves all, no edema, normal tone Skin: dry, intact, normal temperature Psychiatry: normal mood, congruent affect  Labs   I have personally reviewed the following labs and imaging studies CBC    Component Value Date/Time   WBC 9.6 08/22/2023 0501   RBC 4.98 08/22/2023 0501   HGB 14.2 08/22/2023 0501   HCT 44.3 08/22/2023 0501   PLT 237 08/22/2023 0501   MCV 89.0 08/22/2023 0501   MCH 28.5 08/22/2023 0501   MCHC 32.1 08/22/2023 0501   RDW 13.8 08/22/2023 0501   LYMPHSABS 1.2 03/28/2019 0354   MONOABS 0.9 03/28/2019 0354   EOSABS 0.1 03/28/2019 0354   BASOSABS 0.0 03/28/2019 0354      Latest Ref Rng & Units 08/22/2023    5:01 AM 08/21/2023    5:21 AM 08/20/2023    1:02 PM  BMP  Glucose 70 - 99 mg/dL 85  87  161   BUN 8 - 23 mg/dL 9  11  22    Creatinine 0.44 - 1.00 mg/dL 0.96  0.45  4.09   Sodium 135 - 145 mmol/L 139  141  137   Potassium 3.5 - 5.1 mmol/L 4.1  3.9  4.8   Chloride 98 - 111 mmol/L 106  109  103   CO2 22 - 32 mmol/L 24  24  26    Calcium 8.9 - 10.3 mg/dL 8.5  7.9  8.9     MR BRAIN WO CONTRAST  Result Date: 08/20/2023 CLINICAL DATA:  Neuro deficit, acute, stroke suspected EXAM: MRI HEAD WITHOUT CONTRAST TECHNIQUE: Multiplanar, multiecho pulse sequences of the brain and surrounding structures were obtained without intravenous contrast. COMPARISON:  CT Head today.  MRI head January 12, 2017. FINDINGS: Brain: No acute infarction, acute hemorrhage, hydrocephalus, extra-axial collection or mass lesion. On susceptibility weighted imaging, many prior hemorrhages that are predominantly in the cerebellum,  brainstem and thalami. Generalized cerebral atrophy. Patchy white matter T2/FLAIR hyperintensities, compatible with chronic microvascular ischemic change. Vascular: Major arterial flow voids are maintained at the skull base. Skull and upper cervical spine: Normal marrow signal. Sinuses/Orbits: Negative. IMPRESSION: 1. No evidence of acute intracranial abnormality. 2. Many prior microhemorrhages, likely due to chronic hypertension. 3. Generalized cerebral atrophy (ICD10-G31.9) and chronic microvascular ischemic change. Electronically Signed   By: Feliberto Harts M.D.   On: 08/20/2023 17:12    Disposition Plan & Communication  Patient status: Inpatient  Admitted From: ALF Planned disposition location: Skilled nursing facility Anticipated discharge date: medically stable for d/c as of 9/18  Family Communication: none at bedside    Author: Pennie Banter, DO [facility] 08/22/2023, 3:43 PM   Available by Epic secure chat 7AM-7PM. If  7PM-7AM, please contact night-coverage.  [facility] contact information found on [email or web address].

## 2023-08-22 NOTE — [place] FL2 (Signed)
[place] MEDICAID FL2 LEVEL OF CARE FORM     IDENTIFICATION  Patient Name: Angelica Collins Birthdate: Nov 13, 1935 Sex: female Admission Date (Current Location): 08/20/2023  [facility] and [place] Number:  Chiropodist and Address:  [facility], [address], [place], [place] [address]      Provider Number: [id number]  Attending Physician Name and Address:  Pennie Banter, DO  Relative Name and Phone Number:  Blima Singer  [phone]    Current Level of Care: Hospital Recommended Level of Care: Skilled Nursing Facility Prior Approval Number:    Date Approved/Denied:   PASRR Number: [id number]  Discharge Plan: SNF    Current Diagnoses: Patient Active Problem List   Diagnosis Date Noted   Altered mental status 08/21/2023   Dehydration 08/21/2023   Dysphagia 08/21/2023   Community acquired pneumonia 08/20/2023   Cognitive impairment 08/20/2023   Leukocytosis 08/20/2023   Hyperlipidemia 08/20/2023   Depression 08/20/2023   GERD (gastroesophageal reflux disease) 08/20/2023   Sepsis (HCC) 03/26/2019    Orientation RESPIRATION BLADDER Height & Weight     Self, Place  Normal Incontinent, External catheter Weight: 128 lb 9.6 oz (58.3 kg) Height:  5\' 3"  (160 cm)  BEHAVIORAL SYMPTOMS/MOOD NEUROLOGICAL BOWEL NUTRITION STATUS      Continent Diet (see discharge summary)  AMBULATORY STATUS COMMUNICATION OF NEEDS Skin   Limited Assist Verbally Normal                       Personal Care Assistance Level of Assistance  Bathing, Feeding, Dressing, Total care Bathing Assistance: Limited assistance Feeding assistance: Independent Dressing Assistance: Limited assistance Total Care Assistance: Limited assistance   Functional Limitations Info  Sight, Hearing, Speech Sight Info: Adequate Hearing Info: Adequate Speech Info: Adequate    SPECIAL CARE FACTORS FREQUENCY  PT (By licensed PT), OT (By licensed OT)     PT Frequency: min 4x  weekly OT Frequency: min 4x weekly            Contractures Contractures Info: Not present    Additional Factors Info  Code Status, Allergies Code Status Info: DNR - pre arrest interventions desired Allergies Info: NKA           Current Medications (08/22/2023):  This is the current hospital active medication list Current Facility-Administered Medications  Medication Dose Route Frequency Provider Last Rate Last Admin   acetaminophen (TYLENOL) tablet 650 mg  650 mg Oral Q6H PRN Cox, Amy N, DO       Or   acetaminophen (TYLENOL) suppository 650 mg  650 mg Rectal Q6H PRN Cox, Amy N, DO       aspirin EC tablet 81 mg  81 mg Oral Daily Jamelle Rushing L, MD   81 mg at 08/22/23 0827   atorvastatin (LIPITOR) tablet 20 mg  20 mg Oral Daily Cox, Amy N, DO   20 mg at 08/22/23 0827   azithromycin (ZITHROMAX) tablet 500 mg  500 mg Oral Daily Esaw Grandchild A, DO   500 mg at 08/22/23 1015   busPIRone (BUSPAR) tablet 7.5 mg  7.5 mg Oral BID Cox, Amy N, DO   7.5 mg at 08/22/23 8413   calcium-vitamin D (OSCAL WITH D) 500-5 MG-MCG per tablet 2 tablet  2 tablet Oral Q breakfast Cox, Amy N, DO   2 tablet at 08/22/23 0827   cefdinir (OMNICEF) capsule 300 mg  300 mg Oral Q12H Esaw Grandchild A, DO   300 mg at 08/22/23  1015   enoxaparin (LOVENOX) injection 40 mg  40 mg Subcutaneous Q24H Cox, Amy N, DO   40 mg at 08/21/23 2011   famotidine (PEPCID) tablet 10 mg  10 mg Oral BID Cox, Amy N, DO   10 mg at 08/22/23 1610   guaiFENesin (MUCINEX) 12 hr tablet 600 mg  600 mg Oral BID PRN Cox, Amy N, DO       [START ON 08/23/2023] irbesartan (AVAPRO) tablet 150 mg  150 mg Oral Daily Esaw Grandchild A, DO       ondansetron (ZOFRAN) tablet 4 mg  4 mg Oral Q6H PRN Cox, Amy N, DO       Or   ondansetron (ZOFRAN) injection 4 mg  4 mg Intravenous Q6H PRN Cox, Amy N, DO       Oral care mouth rinse  15 mL Mouth Rinse PRN Leeroy Bock, MD       pantoprazole (PROTONIX) EC tablet 40 mg  40 mg Oral BID Cox, Amy N, DO    40 mg at 08/22/23 0827   polyethylene glycol (MIRALAX / GLYCOLAX) packet 17 g  17 g Oral Daily PRN Leeroy Bock, MD       sertraline (ZOLOFT) tablet 100 mg  100 mg Oral Daily Cox, Amy N, DO   100 mg at 08/22/23 9604   zinc oxide (BALMEX) 11.3 % cream 1 Application  1 Application Topical TID PRN Cox, Amy N, DO         Discharge Medications: Please see discharge summary for a list of discharge medications.  Relevant Imaging Results:  Relevant Lab Results:   Additional Information SSN: [id number]  Darolyn Rua, LCSW

## 2023-08-22 NOTE — Progress Notes (Signed)
Physical Therapy Treatment Patient Details Name: Angelica Collins MRN: [id number] DOB: [birth year] Today's Date: 08/22/2023   History of Present Illness 88 year old female with history of cognitive impairment, failure to thrive, HLD, mitral valve prolapse, Raynaud's, ?dementia, who comes to the emergency department from [facility] for chief concerns of altered mental status, worsening failure to thrive, choking and coughing during meals.    PT Comments  Patient alert, oriented to self and place today, much more appropriate in conversation but displayed some situational awareness still. Overall improved activity tolerance and mobility but still required maxA to complete bed mobility and stand pivot to recliner. The patient would benefit from further skilled PT intervention to continue to progress towards goals and maximize function.      If plan is discharge home, recommend the following: Two people to help with walking and/or transfers;A lot of help with bathing/dressing/bathroom;Assistance with feeding;Assistance with cooking/housework;Direct supervision/assist for financial management;Supervision due to cognitive status;Assist for transportation;Help with stairs or ramp for entrance;Direct supervision/assist for medications management   Can travel by private vehicle     No  Equipment Recommendations  None recommended by PT    Recommendations for Other Services       Precautions / Restrictions Precautions Precautions: Fall Restrictions Weight Bearing Restrictions: No     Mobility  Bed Mobility Overal bed mobility: Needs Assistance Bed Mobility: Supine to Sit     Supine to sit: Mod assist          Transfers Overall transfer level: Needs assistance Equipment used: 1 person hand held assist Transfers: Bed to chair/wheelchair/BSC   Stand pivot transfers: Max assist         General transfer comment: reaches for recliner arms to pivot, unsafe set up, needed cues and  ultimately maxA to complete safely    Ambulation/Gait                   Stairs             Wheelchair Mobility     Tilt Bed    Modified Rankin (Stroke Patients Only)       Balance Overall balance assessment: Needs assistance Sitting-balance support: Bilateral upper extremity supported, Feet supported Sitting balance-Leahy Scale: Fair Sitting balance - Comments: progressed to sitting with CGA -supervision over time     Standing balance-Leahy Scale: Poor                              Cognition Arousal: Alert   Overall Cognitive Status: History of cognitive impairments - at baseline Area of Impairment: Safety/judgement, Problem solving                 Orientation Level: Situation       Safety/Judgement: Decreased awareness of safety, Decreased awareness of deficits              Exercises      General Comments        Pertinent Vitals/Pain Pain Assessment Pain Assessment: Faces Faces Pain Scale: No hurt    Home Living                          Prior Function            PT Goals (current goals can now be found in the care plan section) Progress towards PT goals: Progressing toward goals    Frequency    Min 1X/week  PT Plan      Co-evaluation              AM-PAC PT "6 Clicks" Mobility   Outcome Measure  Help needed turning from your back to your side while in a flat bed without using bedrails?: A Lot Help needed moving from lying on your back to sitting on the side of a flat bed without using bedrails?: A Lot Help needed moving to and from a bed to a chair (including a wheelchair)?: A Lot Help needed standing up from a chair using your arms (e.g., wheelchair or bedside chair)?: A Lot Help needed to walk in hospital room?: Total Help needed climbing 3-5 steps with a railing? : Total 6 Click Score: 10    End of Session Equipment Utilized During Treatment: Gait belt Activity Tolerance:  Patient tolerated treatment well Patient left: in chair;with call bell/phone within reach;with chair alarm set   PT Visit Diagnosis: Other abnormalities of gait and mobility (R26.89);Muscle weakness (generalized) (M62.81)     Time: 8295-6213 PT Time Calculation (min) (ACUTE ONLY): 13 min  Charges:    $Therapeutic Activity: 8-22 mins PT General Charges $$ ACUTE PT VISIT: 1 Visit                     Olga Coaster PT, DPT 2:11 PM,08/22/23

## 2023-08-22 NOTE — TOC Progression Note (Signed)
Transition of Care [facility]) - Progression Note    Patient Details  Name: Angelica Collins MRN: [id number] Date of Birth: 1935/11/10  [facility] CM/SW Contact  Chapman Fitch, RN Phone Number: 08/22/2023, 2:00 PM  Clinical Narrative:     Per Marya Landry at [facility] they would have to assess prior to patient returning. She states that she will be able to assess patient tomorrow.  Therapy notes and H&P faxed to [facility] at [phone]       Expected Discharge Plan and Services                                               Social Determinants of Health (SDOH) Interventions SDOH Screenings   Food Insecurity: No Food Insecurity (08/21/2023)  Housing: Low Risk  (08/21/2023)  Transportation Needs: No Transportation Needs (08/21/2023)  Utilities: Not At Risk (08/21/2023)  Tobacco Use: Low Risk  (08/20/2023)    Readmission Risk Interventions     No data to display

## 2023-08-22 NOTE — Plan of Care (Signed)

## 2023-08-22 NOTE — TOC Progression Note (Signed)
Transition of Care [facility]) - Progression Note    Patient Details  Name: Angelica Collins MRN: [id number] Date of Birth: 1935-06-22  Transition of Care [facility]) CM/SW Contact  Chapman Fitch, RN Phone Number: 08/22/2023, 3:21 PM  Clinical Narrative:     Spoke with Erenest Blank If [facility] is not able to meet patient's needs she is in agreement for SNF bed search if needed  Fl2 sent for signature  Bed search initiated     Romilda Joy Preference is [facility]        Expected Discharge Plan and Services                                               Social Determinants of Health (SDOH) Interventions SDOH Screenings   Food Insecurity: No Food Insecurity (08/21/2023)  Housing: Low Risk  (08/21/2023)  Transportation Needs: No Transportation Needs (08/21/2023)  Utilities: Not At Risk (08/21/2023)  Tobacco Use: Low Risk  (08/20/2023)    Readmission Risk Interventions     No data to display

## 2023-08-23 DIAGNOSIS — J189 Pneumonia, unspecified organism: Secondary | ICD-10-CM | POA: Diagnosis not present

## 2023-08-23 NOTE — Plan of Care (Signed)
  Problem: Respiratory: Goal: Ability to maintain adequate ventilation will improve Outcome: Progressing   Problem: Elimination: Goal: Will not experience complications related to bowel motility Outcome: Progressing   Problem: Safety: Goal: Ability to remain free from injury will improve Outcome: Progressing   Problem: Skin Integrity: Goal: Risk for impaired skin integrity will decrease Outcome: Progressing

## 2023-08-23 NOTE — Care Management Important Message (Signed)
Important Message  Patient Details  Name: Angelica Collins MRN: [id number] Date of Birth: 11-28-1935   Medicare Important Message Given:  Yes     [name] 08/23/2023, 1:17 PM

## 2023-08-23 NOTE — Plan of Care (Signed)
  Problem: Respiratory: Goal: Ability to maintain adequate ventilation will improve Outcome: Progressing   Problem: Nutrition: Goal: Adequate nutrition will be maintained Outcome: Progressing   Problem: Coping: Goal: Level of anxiety will decrease Outcome: Progressing   Problem: Safety: Goal: Ability to remain free from injury will improve Outcome: Progressing   Problem: Skin Integrity: Goal: Risk for impaired skin integrity will decrease Outcome: Progressing

## 2023-08-23 NOTE — TOC Progression Note (Signed)
Transition of Care [facility]) - Progression Note    Patient Details  Name: Angelica Collins MRN: [id number] Date of Birth: 16-Aug-1935  Transition of Care [facility]) CM/SW Contact  Chapman Fitch, RN Phone Number: 08/23/2023, 2:55 PM  Clinical Narrative:     Per Marya Landry at [facility], Clinical reviewed and they are unable to accept patient back at discharge and patient will need to go to [facility] first  Bed offers presented to niece Erskine Squibb.  She accepts bed at [facility].  Accepted bed in [facility] and notified Tanya at [facility] Auth started in navi portal        Expected Discharge Plan and Services                                               Social Determinants of Health (SDOH) Interventions SDOH Screenings   Food Insecurity: No Food Insecurity (08/21/2023)  Housing: Low Risk  (08/21/2023)  Transportation Needs: No Transportation Needs (08/21/2023)  Utilities: Not At Risk (08/21/2023)  Tobacco Use: Low Risk  (08/20/2023)    Readmission Risk Interventions     No data to display

## 2023-08-23 NOTE — Progress Notes (Signed)
PROGRESS NOTE  Angelica Collins    DOB: February 24, 1935, 88 y.o.  HKV:[id number]    Code Status: Do not attempt resuscitation (DNR) PRE-ARREST INTERVENTIONS DESIRED   DOA: 08/20/2023   LOS: 3   Brief hospital course  Angelica Collins is a 88 y.o. female with a PMH significant for cognitive impairment, likely dementia, failure to thrive, hyperlipidemia.  They presented from [facility] to the ED on 08/20/2023 with AMS x 1 days. They described increased difficulty with eating and "failure to thrive".   In the ED, it was found that they had temperature of 97.4, respiration rate of 18, heart rate of 81, blood pressure 147/117, SpO2 of 98% on room air.  Significant findings included Serum sodium is 137, potassium 4.8, Mg++ 2.4, chloride 103, bicarb 26, BUN of 22, serum creatinine 1.10, EGFR 49, nonfasting glucose 106, WBC 16.1, hemoglobin 15.4, platelets of 299.  COVID/influenza A/influenza B/RSV PCR were negative.  CT head wo contrast: Read as no acute intracranial process.  Chronic small vessel ischemic changes. MRI brain wo contrast: Read as no evidence of acute intracranial abnormality.  Many prior microhemorrhages, likely due to chronic hypertension.  Generalized cerebral atrophy and chronic microvascular ischemic changes..  They were initially treated with Azithromycin 500 mg IV, ceftriaxone 2 g IV, sodium chloride 1 L bolus.  Patient was admitted to medicine service for further workup and management of pneumonia as outlined in detail below.  08/23/23 -stable on room air  Assessment & Plan  Principal Problem:   Community acquired pneumonia Active Problems:   Cognitive impairment   Leukocytosis   Hyperlipidemia   Depression   GERD (gastroesophageal reflux disease)   Altered mental status   Dehydration   Dysphagia   Generalized weakness  CAP- stable on room air. Bibasilar patchy opacities on chest xray. Treated with empiric IV Rocephin & Zithromax  Transitioned to PO Zithromax and Omnicef to  complete course Insert spirometry, flutter valve  Cognitive impairment- appears to be worsening. From ALF, they report unable to accept her back, needs SNF/rehab, currently requiring more assistance than feasible at ALF.  Rule out acute metabolic causes. Head imaging negative acute findings.  Consistent with vascular dementia.  - SLP evaluation - on dysphagia 3 diet - PT/OT - TOC engaged for placement - delirium precautions  Elevated blood pressure- no treatment at home. SBP elevated up to 200, improved to SBP 150's to 170's on low dose irbesartan started this admission.  - slowly and gradually decrease pressures - irbesartan added - increase dose 75 >> 150 mg daily   GERD (gastroesophageal reflux disease) Famotidine 20 mg p.o. twice daily, pantoprazole 40 mg p.o. twice daily   Depression Home buspirone 7.5 mg p.o. twice daily, sertraline 100 mg daily resumed   Hyperlipidemia Atorvastatin 20 mg daily resumed   Body mass index is 22.78 kg/m.  VTE ppx: enoxaparin (LOVENOX) injection 40 mg Start: 08/20/23 2200 Place TED hose Start: 08/20/23 1815   Diet:     Diet   DIET DYS 3 Room service appropriate? Yes with Assist; Fluid consistency: Thin   Consultants: None   Subjective 08/23/23    Pt seen this AM, pleasantly confused.  Awake sitting up in bed.  She does not know why she is here.  Wants to go home.  States she feels fine.   Objective   Vitals:   08/22/23 1724 08/22/23 2106 08/23/23 0225 08/23/23 0805  BP: (!) 153/75 (!) 140/72 138/72 (!) 177/79  Pulse: 81 78 80 64  Resp:  18 16 16 18   Temp: 98.3 F (36.8 C) 98.6 F (37 C) 98.6 F (37 C) 97.9 F (36.6 C)  TempSrc: Oral   Oral  SpO2: 95% 95% 94% 95%  Weight:      Height:        Intake/Output Summary (Last 24 hours) at 08/23/2023 1625 Last data filed at 08/23/2023 1425 Gross per 24 hour  Intake 120 ml  Output 600 ml  Net -480 ml   Filed Weights   08/20/23 1254  Weight: 58.3 kg     Physical Exam:    General exam: awake, alert, no acute distress, pleasantly confused HEENT: moist mucus membranes, hearing grossly normal  Respiratory system: CTAB, no wheezes, rales or rhonchi, normal respiratory effort. Cardiovascular system: normal S1/S2, RRR, no pedal edema.   Gastrointestinal system: soft, NT, ND, no HSM felt, +bowel sounds. Central nervous system: A&O x 2+. no gross focal neurologic deficits, normal speech Extremities: moves all, no edema, normal tone Skin: dry, intact, normal temperature Psychiatry: normal mood, congruent affect  Labs   I have personally reviewed the following labs and imaging studies CBC    Component Value Date/Time   WBC 9.6 08/22/2023 0501   RBC 4.98 08/22/2023 0501   HGB 14.2 08/22/2023 0501   HCT 44.3 08/22/2023 0501   PLT 237 08/22/2023 0501   MCV 89.0 08/22/2023 0501   MCH 28.5 08/22/2023 0501   MCHC 32.1 08/22/2023 0501   RDW 13.8 08/22/2023 0501   LYMPHSABS 1.2 03/28/2019 0354   MONOABS 0.9 03/28/2019 0354   EOSABS 0.1 03/28/2019 0354   BASOSABS 0.0 03/28/2019 0354      Latest Ref Rng & Units 08/22/2023    5:01 AM 08/21/2023    5:21 AM 08/20/2023    1:02 PM  BMP  Glucose 70 - 99 mg/dL 85  87  062   BUN 8 - 23 mg/dL 9  11  22    Creatinine 0.44 - 1.00 mg/dL 6.94  8.54  6.27   Sodium 135 - 145 mmol/L 139  141  137   Potassium 3.5 - 5.1 mmol/L 4.1  3.9  4.8   Chloride 98 - 111 mmol/L 106  109  103   CO2 22 - 32 mmol/L 24  24  26    Calcium 8.9 - 10.3 mg/dL 8.5  7.9  8.9     No results found.  Disposition Plan & Communication  Patient status: Inpatient  Admitted From: ALF Planned disposition location: Skilled nursing facility Anticipated discharge date: medically stable for d/c as of 9/18  Family Communication: none at bedside    Author: Pennie Banter, DO [facility] 08/23/2023, 4:25 PM   Available by Epic secure chat 7AM-7PM. If 7PM-7AM, please contact night-coverage.  [facility] contact information found on [email or web address].

## 2023-08-24 DIAGNOSIS — J189 Pneumonia, unspecified organism: Secondary | ICD-10-CM | POA: Diagnosis not present

## 2023-08-24 DIAGNOSIS — R4189 Other symptoms and signs involving cognitive functions and awareness: Secondary | ICD-10-CM | POA: Diagnosis not present

## 2023-08-24 MED ORDER — IRBESARTAN 150 MG PO TABS: 150.0000 mg | ORAL_TABLET | Freq: Every day | ORAL | Status: AC

## 2023-08-24 MED ORDER — ACETAMINOPHEN 325 MG PO TABS: 650.0000 mg | ORAL_TABLET | Freq: Four times a day (QID) | ORAL | Status: AC | PRN

## 2023-08-24 MED ORDER — POLYETHYLENE GLYCOL 3350 17 G PO PACK: 17.0000 g | PACK | Freq: Every day | ORAL | Status: AC | PRN

## 2023-08-24 NOTE — Care Management Obs Status (Signed)
MEDICARE OBSERVATION STATUS NOTIFICATION   Patient Details  Name: Angelica Collins MRN: [id number] Date of Birth: 16-Feb-1935   Medicare Observation Status Notification Given:  Yes    Chapman Fitch, RN 08/24/2023, 11:37 AM

## 2023-08-24 NOTE — Plan of Care (Signed)
Problem: Fluid Volume: Goal: Hemodynamic stability will improve Outcome: Progressing   Problem: Clinical Measurements: Goal: Diagnostic test results will improve Outcome: Progressing Goal: Signs and symptoms of infection will decrease Outcome: Progressing

## 2023-08-24 NOTE — Discharge Summary (Signed)
Physician Discharge Summary   Patient: Angelica Collins MRN: [id number] DOB: 1934-12-25  Admit date:     08/20/2023  Discharge date: 08/24/23  Discharge Physician: Pennie Banter   PCP: [facility]   Recommendations at discharge:   Follow up with Primary Care in 1-2 weeks Repeat labs at follow up Follow up on blood pressure control. Pt was started on antihypertensive therapy due to persistently elevated BP's during admission.  Adjust regimen as needed at follow up. Advise to check BP readings 1-2 times daily until seen for follow up  Discharge Diagnoses: Principal Problem:   Community acquired pneumonia Active Problems:   Cognitive impairment   Leukocytosis   Hyperlipidemia   Depression   GERD (gastroesophageal reflux disease)   Altered mental status   Dehydration   Dysphagia   Generalized weakness   CAP (community acquired pneumonia)  Resolved Problems:   * No resolved hospital problems. *  Hospital Course:  Angelica Collins is a 88 y.o. female with a PMH significant for cognitive impairment, likely dementia, failure to thrive, hyperlipidemia.   They presented from [facility] to the ED on 08/20/2023 with AMS x 1 days. They described increased difficulty with eating and "failure to thrive".    In the ED, it was found that they had temperature of 97.4, respiration rate of 18, heart rate of 81, blood pressure 147/117, SpO2 of 98% on room air.  Significant findings included Serum sodium is 137, potassium 4.8, Mg++ 2.4, chloride 103, bicarb 26, BUN of 22, serum creatinine 1.10, EGFR 49, nonfasting glucose 106, WBC 16.1, hemoglobin 15.4, platelets of 299.  COVID/influenza A/influenza B/RSV PCR were negative.  CT head wo contrast: Read as no acute intracranial process.  Chronic small vessel ischemic changes. MRI brain wo contrast: Read as no evidence of acute intracranial abnormality.  Many prior microhemorrhages, likely due to chronic hypertension.  Generalized cerebral  atrophy and chronic microvascular ischemic changes..   They were initially treated with Azithromycin 500 mg IV, ceftriaxone 2 g IV, sodium chloride 1 L bolus.   Patient was admitted to medicine service for further workup and management of pneumonia as outlined in detail below.   08/23/23 -stable on room air  08/24/23 -- pt denies any symptoms, says she feels well.  She remains pleasantly confused, stating "I feel fine, but I don't know anything".  Pt is medically stable for discharge to SNF/rehab today prior to return to [facility].  Assessment and Plan:  CAP- stable on room air. Bibasilar patchy opacities on chest xray. Treated with empiric IV Rocephin & Zithromax  Transitioned to PO Zithromax and Omnicef and completed course Insert spirometry, flutter valve --As needed cough medication for any persistent cough.   Cognitive impairment- appears to be worsening. From ALF, they report unable to accept her back, needs SNF/rehab, currently requiring more assistance than feasible at ALF.  Rule out acute metabolic causes. Head imaging negative acute findings.  Consistent with vascular dementia.  - SLP evaluation - on dysphagia 3 diet - PT/OT - SNF needed prior to return to ALF - TOC for placement - delirium precautions   Elevated blood pressure- not on medication prior to admission SBP elevated up to 200, improved to SBP 150's to 170's on low dose irbesartan started this admission.  - slowly and gradually decrease pressures - irbesartan added - increased dose 75 >> 150 mg daily - check BP's 1-2 times daily - PCP follow up within 1-2 weeks    GERD (gastroesophageal reflux  for reference.   Imaging Studies: MR BRAIN WO CONTRAST  Result Date: 08/20/2023 CLINICAL DATA:  Neuro deficit, acute, stroke suspected EXAM: MRI HEAD WITHOUT CONTRAST TECHNIQUE: Multiplanar, multiecho pulse sequences of the brain and surrounding structures were obtained without intravenous contrast. COMPARISON:  CT Head today.  MRI head January 12, 2017. FINDINGS: Brain: No acute infarction, acute hemorrhage, hydrocephalus, extra-axial collection or mass lesion. On susceptibility weighted imaging, many prior hemorrhages that are predominantly in the cerebellum, brainstem and thalami. Generalized cerebral atrophy. Patchy white matter T2/FLAIR hyperintensities, compatible with chronic microvascular ischemic change. Vascular: Major arterial flow voids are maintained at the skull base. Skull and upper cervical spine: Normal  marrow signal. Sinuses/Orbits: Negative. IMPRESSION: 1. No evidence of acute intracranial abnormality. 2. Many prior microhemorrhages, likely due to chronic hypertension. 3. Generalized cerebral atrophy (ICD10-G31.9) and chronic microvascular ischemic change. Electronically Signed   By: Feliberto Harts M.D.   On: 08/20/2023 17:12   CT Head Wo Contrast  Result Date: 08/20/2023 CLINICAL DATA:  Neurologic deficit, altered level of consciousness, failure to thrive EXAM: CT HEAD WITHOUT CONTRAST TECHNIQUE: Contiguous axial images were obtained from the base of the skull through the vertex without intravenous contrast. RADIATION DOSE REDUCTION: This exam was performed according to the departmental dose-optimization program which includes automated exposure control, adjustment of the mA and/or kV according to patient size and/or use of iterative reconstruction technique. COMPARISON:  12/24/2020 FINDINGS: Brain: Chronic small vessel ischemic changes are seen throughout the periventricular white matter, bilateral basal ganglia, and cerebellum. No evidence of acute infarct or hemorrhage. Lateral ventricles and midline structures are otherwise unremarkable. No acute extra-axial fluid collections. No mass effect. Vascular: No hyperdense vessel or unexpected calcification. Stable atherosclerosis. Skull: Normal. Negative for fracture or focal lesion. Sinuses/Orbits: No acute finding. Other: None. IMPRESSION: 1. No acute intracranial process. Chronic small vessel ischemic changes as above. Electronically Signed   By: Sharlet Salina M.D.   On: 08/20/2023 16:19   DG Chest 2 View  Result Date: 08/20/2023 CLINICAL DATA:  Altered mental status with cough and leukocytosis EXAM: CHEST - 2 VIEW COMPARISON:  Chest radiograph dated 06/27/2023 FINDINGS: Normal lung volumes. Bibasilar patchy opacities. Unchanged blunting of the left lateral costophrenic angle. No pneumothorax. The heart size and mediastinal contours are within  normal limits. Old right rib fractures. IMPRESSION: Bibasilar patchy opacities, which may represent atelectasis or pneumonia. Electronically Signed   By: Agustin Cree M.D.   On: 08/20/2023 15:56   DG SWALLOW FUNC OP MEDICARE SPEECH PATH  Result Date: 07/25/2023 Table formatting from the original result was not included. Modified Barium Swallow Study Patient Details Name: Ilaina Bouler MRN: [id number] Date of Birth: [birth year] Today's Date: 07/25/2023 HPI/PMH: Per ED note: 06/2023: "pt is a 88 y.o. female with past medical history of Dementia, Falls, hyperlipidemia who presents to the ED for possible aspiration.  Per EMS, staff at [facility] assisted living was concerned that patient choked on her medications earlier this morning and wanted her evaluated.  Patient states that she had difficulty swallowing one of her pills and had to cough it back up, has been dealing with a cough since then.  She has not had any difficulty breathing and denies any pain in her chest, has not had any fevers.  She states that she feels well and is requesting to be discharged home.  Niece at bedside states patient is at her baseline mental status.". Pt resides at [facility]. She has been eating a Comptroller diet w/  Physician Discharge Summary   Patient: Angelica Collins MRN: [id number] DOB: 1934-12-25  Admit date:     08/20/2023  Discharge date: 08/24/23  Discharge Physician: Pennie Banter   PCP: [facility]   Recommendations at discharge:   Follow up with Primary Care in 1-2 weeks Repeat labs at follow up Follow up on blood pressure control. Pt was started on antihypertensive therapy due to persistently elevated BP's during admission.  Adjust regimen as needed at follow up. Advise to check BP readings 1-2 times daily until seen for follow up  Discharge Diagnoses: Principal Problem:   Community acquired pneumonia Active Problems:   Cognitive impairment   Leukocytosis   Hyperlipidemia   Depression   GERD (gastroesophageal reflux disease)   Altered mental status   Dehydration   Dysphagia   Generalized weakness   CAP (community acquired pneumonia)  Resolved Problems:   * No resolved hospital problems. *  Hospital Course:  Angelica Collins is a 88 y.o. female with a PMH significant for cognitive impairment, likely dementia, failure to thrive, hyperlipidemia.   They presented from [facility] to the ED on 08/20/2023 with AMS x 1 days. They described increased difficulty with eating and "failure to thrive".    In the ED, it was found that they had temperature of 97.4, respiration rate of 18, heart rate of 81, blood pressure 147/117, SpO2 of 98% on room air.  Significant findings included Serum sodium is 137, potassium 4.8, Mg++ 2.4, chloride 103, bicarb 26, BUN of 22, serum creatinine 1.10, EGFR 49, nonfasting glucose 106, WBC 16.1, hemoglobin 15.4, platelets of 299.  COVID/influenza A/influenza B/RSV PCR were negative.  CT head wo contrast: Read as no acute intracranial process.  Chronic small vessel ischemic changes. MRI brain wo contrast: Read as no evidence of acute intracranial abnormality.  Many prior microhemorrhages, likely due to chronic hypertension.  Generalized cerebral  atrophy and chronic microvascular ischemic changes..   They were initially treated with Azithromycin 500 mg IV, ceftriaxone 2 g IV, sodium chloride 1 L bolus.   Patient was admitted to medicine service for further workup and management of pneumonia as outlined in detail below.   08/23/23 -stable on room air  08/24/23 -- pt denies any symptoms, says she feels well.  She remains pleasantly confused, stating "I feel fine, but I don't know anything".  Pt is medically stable for discharge to SNF/rehab today prior to return to [facility].  Assessment and Plan:  CAP- stable on room air. Bibasilar patchy opacities on chest xray. Treated with empiric IV Rocephin & Zithromax  Transitioned to PO Zithromax and Omnicef and completed course Insert spirometry, flutter valve --As needed cough medication for any persistent cough.   Cognitive impairment- appears to be worsening. From ALF, they report unable to accept her back, needs SNF/rehab, currently requiring more assistance than feasible at ALF.  Rule out acute metabolic causes. Head imaging negative acute findings.  Consistent with vascular dementia.  - SLP evaluation - on dysphagia 3 diet - PT/OT - SNF needed prior to return to ALF - TOC for placement - delirium precautions   Elevated blood pressure- not on medication prior to admission SBP elevated up to 200, improved to SBP 150's to 170's on low dose irbesartan started this admission.  - slowly and gradually decrease pressures - irbesartan added - increased dose 75 >> 150 mg daily - check BP's 1-2 times daily - PCP follow up within 1-2 weeks    GERD (gastroesophageal reflux  Physician Discharge Summary   Patient: Angelica Collins MRN: [id number] DOB: 1934-12-25  Admit date:     08/20/2023  Discharge date: 08/24/23  Discharge Physician: Pennie Banter   PCP: [facility]   Recommendations at discharge:   Follow up with Primary Care in 1-2 weeks Repeat labs at follow up Follow up on blood pressure control. Pt was started on antihypertensive therapy due to persistently elevated BP's during admission.  Adjust regimen as needed at follow up. Advise to check BP readings 1-2 times daily until seen for follow up  Discharge Diagnoses: Principal Problem:   Community acquired pneumonia Active Problems:   Cognitive impairment   Leukocytosis   Hyperlipidemia   Depression   GERD (gastroesophageal reflux disease)   Altered mental status   Dehydration   Dysphagia   Generalized weakness   CAP (community acquired pneumonia)  Resolved Problems:   * No resolved hospital problems. *  Hospital Course:  Angelica Collins is a 88 y.o. female with a PMH significant for cognitive impairment, likely dementia, failure to thrive, hyperlipidemia.   They presented from [facility] to the ED on 08/20/2023 with AMS x 1 days. They described increased difficulty with eating and "failure to thrive".    In the ED, it was found that they had temperature of 97.4, respiration rate of 18, heart rate of 81, blood pressure 147/117, SpO2 of 98% on room air.  Significant findings included Serum sodium is 137, potassium 4.8, Mg++ 2.4, chloride 103, bicarb 26, BUN of 22, serum creatinine 1.10, EGFR 49, nonfasting glucose 106, WBC 16.1, hemoglobin 15.4, platelets of 299.  COVID/influenza A/influenza B/RSV PCR were negative.  CT head wo contrast: Read as no acute intracranial process.  Chronic small vessel ischemic changes. MRI brain wo contrast: Read as no evidence of acute intracranial abnormality.  Many prior microhemorrhages, likely due to chronic hypertension.  Generalized cerebral  atrophy and chronic microvascular ischemic changes..   They were initially treated with Azithromycin 500 mg IV, ceftriaxone 2 g IV, sodium chloride 1 L bolus.   Patient was admitted to medicine service for further workup and management of pneumonia as outlined in detail below.   08/23/23 -stable on room air  08/24/23 -- pt denies any symptoms, says she feels well.  She remains pleasantly confused, stating "I feel fine, but I don't know anything".  Pt is medically stable for discharge to SNF/rehab today prior to return to [facility].  Assessment and Plan:  CAP- stable on room air. Bibasilar patchy opacities on chest xray. Treated with empiric IV Rocephin & Zithromax  Transitioned to PO Zithromax and Omnicef and completed course Insert spirometry, flutter valve --As needed cough medication for any persistent cough.   Cognitive impairment- appears to be worsening. From ALF, they report unable to accept her back, needs SNF/rehab, currently requiring more assistance than feasible at ALF.  Rule out acute metabolic causes. Head imaging negative acute findings.  Consistent with vascular dementia.  - SLP evaluation - on dysphagia 3 diet - PT/OT - SNF needed prior to return to ALF - TOC for placement - delirium precautions   Elevated blood pressure- not on medication prior to admission SBP elevated up to 200, improved to SBP 150's to 170's on low dose irbesartan started this admission.  - slowly and gradually decrease pressures - irbesartan added - increased dose 75 >> 150 mg daily - check BP's 1-2 times daily - PCP follow up within 1-2 weeks    GERD (gastroesophageal reflux  Physician Discharge Summary   Patient: Angelica Collins MRN: [id number] DOB: 1934-12-25  Admit date:     08/20/2023  Discharge date: 08/24/23  Discharge Physician: Pennie Banter   PCP: [facility]   Recommendations at discharge:   Follow up with Primary Care in 1-2 weeks Repeat labs at follow up Follow up on blood pressure control. Pt was started on antihypertensive therapy due to persistently elevated BP's during admission.  Adjust regimen as needed at follow up. Advise to check BP readings 1-2 times daily until seen for follow up  Discharge Diagnoses: Principal Problem:   Community acquired pneumonia Active Problems:   Cognitive impairment   Leukocytosis   Hyperlipidemia   Depression   GERD (gastroesophageal reflux disease)   Altered mental status   Dehydration   Dysphagia   Generalized weakness   CAP (community acquired pneumonia)  Resolved Problems:   * No resolved hospital problems. *  Hospital Course:  Angelica Collins is a 88 y.o. female with a PMH significant for cognitive impairment, likely dementia, failure to thrive, hyperlipidemia.   They presented from [facility] to the ED on 08/20/2023 with AMS x 1 days. They described increased difficulty with eating and "failure to thrive".    In the ED, it was found that they had temperature of 97.4, respiration rate of 18, heart rate of 81, blood pressure 147/117, SpO2 of 98% on room air.  Significant findings included Serum sodium is 137, potassium 4.8, Mg++ 2.4, chloride 103, bicarb 26, BUN of 22, serum creatinine 1.10, EGFR 49, nonfasting glucose 106, WBC 16.1, hemoglobin 15.4, platelets of 299.  COVID/influenza A/influenza B/RSV PCR were negative.  CT head wo contrast: Read as no acute intracranial process.  Chronic small vessel ischemic changes. MRI brain wo contrast: Read as no evidence of acute intracranial abnormality.  Many prior microhemorrhages, likely due to chronic hypertension.  Generalized cerebral  atrophy and chronic microvascular ischemic changes..   They were initially treated with Azithromycin 500 mg IV, ceftriaxone 2 g IV, sodium chloride 1 L bolus.   Patient was admitted to medicine service for further workup and management of pneumonia as outlined in detail below.   08/23/23 -stable on room air  08/24/23 -- pt denies any symptoms, says she feels well.  She remains pleasantly confused, stating "I feel fine, but I don't know anything".  Pt is medically stable for discharge to SNF/rehab today prior to return to [facility].  Assessment and Plan:  CAP- stable on room air. Bibasilar patchy opacities on chest xray. Treated with empiric IV Rocephin & Zithromax  Transitioned to PO Zithromax and Omnicef and completed course Insert spirometry, flutter valve --As needed cough medication for any persistent cough.   Cognitive impairment- appears to be worsening. From ALF, they report unable to accept her back, needs SNF/rehab, currently requiring more assistance than feasible at ALF.  Rule out acute metabolic causes. Head imaging negative acute findings.  Consistent with vascular dementia.  - SLP evaluation - on dysphagia 3 diet - PT/OT - SNF needed prior to return to ALF - TOC for placement - delirium precautions   Elevated blood pressure- not on medication prior to admission SBP elevated up to 200, improved to SBP 150's to 170's on low dose irbesartan started this admission.  - slowly and gradually decrease pressures - irbesartan added - increased dose 75 >> 150 mg daily - check BP's 1-2 times daily - PCP follow up within 1-2 weeks    GERD (gastroesophageal reflux  for reference.   Imaging Studies: MR BRAIN WO CONTRAST  Result Date: 08/20/2023 CLINICAL DATA:  Neuro deficit, acute, stroke suspected EXAM: MRI HEAD WITHOUT CONTRAST TECHNIQUE: Multiplanar, multiecho pulse sequences of the brain and surrounding structures were obtained without intravenous contrast. COMPARISON:  CT Head today.  MRI head January 12, 2017. FINDINGS: Brain: No acute infarction, acute hemorrhage, hydrocephalus, extra-axial collection or mass lesion. On susceptibility weighted imaging, many prior hemorrhages that are predominantly in the cerebellum, brainstem and thalami. Generalized cerebral atrophy. Patchy white matter T2/FLAIR hyperintensities, compatible with chronic microvascular ischemic change. Vascular: Major arterial flow voids are maintained at the skull base. Skull and upper cervical spine: Normal  marrow signal. Sinuses/Orbits: Negative. IMPRESSION: 1. No evidence of acute intracranial abnormality. 2. Many prior microhemorrhages, likely due to chronic hypertension. 3. Generalized cerebral atrophy (ICD10-G31.9) and chronic microvascular ischemic change. Electronically Signed   By: Feliberto Harts M.D.   On: 08/20/2023 17:12   CT Head Wo Contrast  Result Date: 08/20/2023 CLINICAL DATA:  Neurologic deficit, altered level of consciousness, failure to thrive EXAM: CT HEAD WITHOUT CONTRAST TECHNIQUE: Contiguous axial images were obtained from the base of the skull through the vertex without intravenous contrast. RADIATION DOSE REDUCTION: This exam was performed according to the departmental dose-optimization program which includes automated exposure control, adjustment of the mA and/or kV according to patient size and/or use of iterative reconstruction technique. COMPARISON:  12/24/2020 FINDINGS: Brain: Chronic small vessel ischemic changes are seen throughout the periventricular white matter, bilateral basal ganglia, and cerebellum. No evidence of acute infarct or hemorrhage. Lateral ventricles and midline structures are otherwise unremarkable. No acute extra-axial fluid collections. No mass effect. Vascular: No hyperdense vessel or unexpected calcification. Stable atherosclerosis. Skull: Normal. Negative for fracture or focal lesion. Sinuses/Orbits: No acute finding. Other: None. IMPRESSION: 1. No acute intracranial process. Chronic small vessel ischemic changes as above. Electronically Signed   By: Sharlet Salina M.D.   On: 08/20/2023 16:19   DG Chest 2 View  Result Date: 08/20/2023 CLINICAL DATA:  Altered mental status with cough and leukocytosis EXAM: CHEST - 2 VIEW COMPARISON:  Chest radiograph dated 06/27/2023 FINDINGS: Normal lung volumes. Bibasilar patchy opacities. Unchanged blunting of the left lateral costophrenic angle. No pneumothorax. The heart size and mediastinal contours are within  normal limits. Old right rib fractures. IMPRESSION: Bibasilar patchy opacities, which may represent atelectasis or pneumonia. Electronically Signed   By: Agustin Cree M.D.   On: 08/20/2023 15:56   DG SWALLOW FUNC OP MEDICARE SPEECH PATH  Result Date: 07/25/2023 Table formatting from the original result was not included. Modified Barium Swallow Study Patient Details Name: Ilaina Bouler MRN: [id number] Date of Birth: [birth year] Today's Date: 07/25/2023 HPI/PMH: Per ED note: 06/2023: "pt is a 88 y.o. female with past medical history of Dementia, Falls, hyperlipidemia who presents to the ED for possible aspiration.  Per EMS, staff at [facility] assisted living was concerned that patient choked on her medications earlier this morning and wanted her evaluated.  Patient states that she had difficulty swallowing one of her pills and had to cough it back up, has been dealing with a cough since then.  She has not had any difficulty breathing and denies any pain in her chest, has not had any fevers.  She states that she feels well and is requesting to be discharged home.  Niece at bedside states patient is at her baseline mental status.". Pt resides at [facility]. She has been eating a Comptroller diet w/  for reference.   Imaging Studies: MR BRAIN WO CONTRAST  Result Date: 08/20/2023 CLINICAL DATA:  Neuro deficit, acute, stroke suspected EXAM: MRI HEAD WITHOUT CONTRAST TECHNIQUE: Multiplanar, multiecho pulse sequences of the brain and surrounding structures were obtained without intravenous contrast. COMPARISON:  CT Head today.  MRI head January 12, 2017. FINDINGS: Brain: No acute infarction, acute hemorrhage, hydrocephalus, extra-axial collection or mass lesion. On susceptibility weighted imaging, many prior hemorrhages that are predominantly in the cerebellum, brainstem and thalami. Generalized cerebral atrophy. Patchy white matter T2/FLAIR hyperintensities, compatible with chronic microvascular ischemic change. Vascular: Major arterial flow voids are maintained at the skull base. Skull and upper cervical spine: Normal  marrow signal. Sinuses/Orbits: Negative. IMPRESSION: 1. No evidence of acute intracranial abnormality. 2. Many prior microhemorrhages, likely due to chronic hypertension. 3. Generalized cerebral atrophy (ICD10-G31.9) and chronic microvascular ischemic change. Electronically Signed   By: Feliberto Harts M.D.   On: 08/20/2023 17:12   CT Head Wo Contrast  Result Date: 08/20/2023 CLINICAL DATA:  Neurologic deficit, altered level of consciousness, failure to thrive EXAM: CT HEAD WITHOUT CONTRAST TECHNIQUE: Contiguous axial images were obtained from the base of the skull through the vertex without intravenous contrast. RADIATION DOSE REDUCTION: This exam was performed according to the departmental dose-optimization program which includes automated exposure control, adjustment of the mA and/or kV according to patient size and/or use of iterative reconstruction technique. COMPARISON:  12/24/2020 FINDINGS: Brain: Chronic small vessel ischemic changes are seen throughout the periventricular white matter, bilateral basal ganglia, and cerebellum. No evidence of acute infarct or hemorrhage. Lateral ventricles and midline structures are otherwise unremarkable. No acute extra-axial fluid collections. No mass effect. Vascular: No hyperdense vessel or unexpected calcification. Stable atherosclerosis. Skull: Normal. Negative for fracture or focal lesion. Sinuses/Orbits: No acute finding. Other: None. IMPRESSION: 1. No acute intracranial process. Chronic small vessel ischemic changes as above. Electronically Signed   By: Sharlet Salina M.D.   On: 08/20/2023 16:19   DG Chest 2 View  Result Date: 08/20/2023 CLINICAL DATA:  Altered mental status with cough and leukocytosis EXAM: CHEST - 2 VIEW COMPARISON:  Chest radiograph dated 06/27/2023 FINDINGS: Normal lung volumes. Bibasilar patchy opacities. Unchanged blunting of the left lateral costophrenic angle. No pneumothorax. The heart size and mediastinal contours are within  normal limits. Old right rib fractures. IMPRESSION: Bibasilar patchy opacities, which may represent atelectasis or pneumonia. Electronically Signed   By: Agustin Cree M.D.   On: 08/20/2023 15:56   DG SWALLOW FUNC OP MEDICARE SPEECH PATH  Result Date: 07/25/2023 Table formatting from the original result was not included. Modified Barium Swallow Study Patient Details Name: Ilaina Bouler MRN: [id number] Date of Birth: [birth year] Today's Date: 07/25/2023 HPI/PMH: Per ED note: 06/2023: "pt is a 88 y.o. female with past medical history of Dementia, Falls, hyperlipidemia who presents to the ED for possible aspiration.  Per EMS, staff at [facility] assisted living was concerned that patient choked on her medications earlier this morning and wanted her evaluated.  Patient states that she had difficulty swallowing one of her pills and had to cough it back up, has been dealing with a cough since then.  She has not had any difficulty breathing and denies any pain in her chest, has not had any fevers.  She states that she feels well and is requesting to be discharged home.  Niece at bedside states patient is at her baseline mental status.". Pt resides at [facility]. She has been eating a Comptroller diet w/

## 2023-08-24 NOTE — Progress Notes (Signed)
Mobility Specialist - Progress Note     08/24/23 1134  Mobility  Activity Stood at bedside;Transferred from bed to chair  Level of Assistance Moderate assist, patient does 50-74%  Assistive Device Front wheel walker  Distance Ambulated (ft) 2 ft  Range of Motion/Exercises Active  Activity Response Tolerated well  Mobility Referral Yes  $Mobility charge 1 Mobility  Mobility Specialist Start Time (ACUTE ONLY) 1115  Mobility Specialist Stop Time (ACUTE ONLY) 1139  Mobility Specialist Time Calculation (min) (ACUTE ONLY) 24 min   Pt resting in bed on RA upon entry. Pt agreeable to get to the chair this morning for breakfast/lunch. Pt STS x2 ModA +2 (hygiene care provided) with RW and transferred to recliner MaxA +1. Pt left with needs in reach and chair alarm activated.   Johnathan Hausen Mobility Specialist 08/24/23, 11:46 AM

## 2023-08-24 NOTE — Care Management CC44 (Signed)
Condition Code 44 Documentation Completed  Patient Details  Name: Mckinsley Gonce MRN: [id number] Date of Birth: [birth year]   Condition Code 44 given:  Yes Patient signature on Condition Code 44 notice:  Yes Documentation of 2 MD's agreement:  Yes Code 44 added to claim:  Yes    Chapman Fitch, RN 08/24/2023, 11:37 AM

## 2023-08-24 NOTE — Plan of Care (Signed)
  Problem: Fluid Volume: Goal: Hemodynamic stability will improve Outcome: Adequate for Discharge   Problem: Clinical Measurements: Goal: Diagnostic test results will improve Outcome: Adequate for Discharge Goal: Signs and symptoms of infection will decrease Outcome: Adequate for Discharge   Problem: Respiratory: Goal: Ability to maintain adequate ventilation will improve Outcome: Adequate for Discharge   Problem: Education: Goal: Knowledge of General Education information will improve Description: Including pain rating scale, medication(s)/side effects and non-pharmacologic comfort measures Outcome: Adequate for Discharge   Problem: Health Behavior/Discharge Planning: Goal: Ability to manage health-related needs will improve Outcome: Adequate for Discharge   Problem: Clinical Measurements: Goal: Ability to maintain clinical measurements within normal limits will improve Outcome: Adequate for Discharge Goal: Will remain free from infection Outcome: Adequate for Discharge Goal: Diagnostic test results will improve Outcome: Adequate for Discharge Goal: Respiratory complications will improve Outcome: Adequate for Discharge Goal: Cardiovascular complication will be avoided Outcome: Adequate for Discharge   Problem: Activity: Goal: Risk for activity intolerance will decrease Outcome: Adequate for Discharge   Problem: Nutrition: Goal: Adequate nutrition will be maintained Outcome: Adequate for Discharge   Problem: Coping: Goal: Level of anxiety will decrease Outcome: Adequate for Discharge   Problem: Elimination: Goal: Will not experience complications related to bowel motility Outcome: Adequate for Discharge Goal: Will not experience complications related to urinary retention Outcome: Adequate for Discharge   Problem: Pain Managment: Goal: General experience of comfort will improve Outcome: Adequate for Discharge   Problem: Safety: Goal: Ability to remain free  from injury will improve Outcome: Adequate for Discharge   Problem: Skin Integrity: Goal: Risk for impaired skin integrity will decrease Outcome: Adequate for Discharge   Pt report attempted to be called to [facility], waiting for return call. Pt has all belongings. Transport team has received all paperwork. Pt being transferred to facility via stretcher with EMS transport team

## 2023-08-24 NOTE — TOC Transition Note (Signed)
Transition of Care [facility]) - CM/SW Discharge Note   Patient Details  Name: Cayman Fort MRN: [id number] Date of Birth: May 08, 1935  Transition of Care [facility]) CM/SW Contact:  Chapman Fitch, RN Phone Number: 08/24/2023, 11:45 AM   Clinical Narrative:     Berkley Harvey has been obtained for [facility]  Patient will DC to: [facility] Anticipated DC date: 08/24/23  Family notified: Erskine Squibb Transport by: [facility] updated   Per MD patient ready for DC to . RN, patient, patient's family, and facility notified of DC. Discharge Summary sent to facility. RN given number for report. DC packet on chart. Ambulance transport requested for patient.  TOC signing off.          Patient Goals and CMS Choice      Discharge Placement                         Discharge Plan and Services Additional resources added to the After Visit Summary for                                       Social Determinants of Health (SDOH) Interventions SDOH Screenings   Food Insecurity: No Food Insecurity (08/21/2023)  Housing: Low Risk  (08/21/2023)  Transportation Needs: No Transportation Needs (08/21/2023)  Utilities: Not At Risk (08/21/2023)  Tobacco Use: Low Risk  (08/20/2023)     Readmission Risk Interventions     No data to display
